# Patient Record
Sex: Female | Born: 1945 | Race: White | Hispanic: No | Marital: Single | Smoking: Never smoker
Health system: Southern US, Community
[De-identification: ages and names within clinical notes are randomized; demographics above are authoritative.]

## PROBLEM LIST (undated history)

## (undated) ENCOUNTER — Ambulatory Visit: Admission: EM | Payer: Medicare HMO | Source: Home / Self Care

## (undated) DIAGNOSIS — R7303 Prediabetes: Secondary | ICD-10-CM

## (undated) DIAGNOSIS — R609 Edema, unspecified: Secondary | ICD-10-CM

## (undated) DIAGNOSIS — E119 Type 2 diabetes mellitus without complications: Secondary | ICD-10-CM

## (undated) DIAGNOSIS — M792 Neuralgia and neuritis, unspecified: Secondary | ICD-10-CM

## (undated) DIAGNOSIS — D369 Benign neoplasm, unspecified site: Secondary | ICD-10-CM

## (undated) DIAGNOSIS — K219 Gastro-esophageal reflux disease without esophagitis: Secondary | ICD-10-CM

## (undated) HISTORY — DX: Type 2 diabetes mellitus without complications: E11.9

## (undated) HISTORY — PX: OTHER SURGICAL HISTORY: SHX169

## (undated) HISTORY — PX: REPLACEMENT TOTAL KNEE: SUR1224

## (undated) HISTORY — PX: HERNIA REPAIR: SHX51

## (undated) HISTORY — PX: TONSILLECTOMY: SUR1361

---

## 2008-06-16 ENCOUNTER — Ambulatory Visit: Payer: Self-pay

## 2009-10-12 ENCOUNTER — Ambulatory Visit: Payer: Self-pay

## 2010-04-29 ENCOUNTER — Ambulatory Visit: Payer: Self-pay | Admitting: Unknown Physician Specialty

## 2010-10-25 ENCOUNTER — Ambulatory Visit: Payer: Self-pay

## 2012-02-20 DIAGNOSIS — M1710 Unilateral primary osteoarthritis, unspecified knee: Secondary | ICD-10-CM | POA: Insufficient documentation

## 2012-02-22 ENCOUNTER — Ambulatory Visit: Payer: Self-pay

## 2013-08-20 DIAGNOSIS — H9319 Tinnitus, unspecified ear: Secondary | ICD-10-CM | POA: Insufficient documentation

## 2013-09-27 ENCOUNTER — Ambulatory Visit: Payer: Self-pay | Admitting: Family Medicine

## 2014-04-04 DIAGNOSIS — M25511 Pain in right shoulder: Secondary | ICD-10-CM | POA: Insufficient documentation

## 2014-08-28 DIAGNOSIS — M1711 Unilateral primary osteoarthritis, right knee: Secondary | ICD-10-CM | POA: Insufficient documentation

## 2014-11-13 ENCOUNTER — Ambulatory Visit: Payer: Self-pay | Admitting: Family Medicine

## 2014-11-13 LAB — URINALYSIS, COMPLETE
Bilirubin,UR: NEGATIVE
GLUCOSE, UR: NEGATIVE
Ketone: NEGATIVE
Nitrite: NEGATIVE
PH: 7 (ref 5.0–8.0)
Protein: NEGATIVE
SPECIFIC GRAVITY: 1.015 (ref 1.000–1.030)
WBC UR: >30 /HPF (ref 0–5)

## 2014-11-14 LAB — URINE CULTURE

## 2015-05-08 ENCOUNTER — Other Ambulatory Visit: Payer: Self-pay | Admitting: Family Medicine

## 2015-05-08 DIAGNOSIS — Z1231 Encounter for screening mammogram for malignant neoplasm of breast: Secondary | ICD-10-CM

## 2015-05-13 ENCOUNTER — Ambulatory Visit
Admission: RE | Admit: 2015-05-13 | Discharge: 2015-05-13 | Disposition: A | Discharge status: Home / Self Care | Payer: Medicare Other | Source: Ambulatory Visit | Attending: Family Medicine | Admitting: Family Medicine

## 2015-05-13 DIAGNOSIS — Z1231 Encounter for screening mammogram for malignant neoplasm of breast: Secondary | ICD-10-CM

## 2015-05-28 ENCOUNTER — Other Ambulatory Visit: Payer: Self-pay | Admitting: Family Medicine

## 2015-05-28 DIAGNOSIS — R928 Other abnormal and inconclusive findings on diagnostic imaging of breast: Secondary | ICD-10-CM

## 2015-05-29 ENCOUNTER — Ambulatory Visit
Admission: RE | Admit: 2015-05-29 | Discharge: 2015-05-29 | Disposition: A | Discharge status: Home / Self Care | Payer: Medicare Other | Source: Ambulatory Visit | Attending: Family Medicine | Admitting: Family Medicine

## 2015-05-29 DIAGNOSIS — R928 Other abnormal and inconclusive findings on diagnostic imaging of breast: Secondary | ICD-10-CM

## 2015-05-29 DIAGNOSIS — N6002 Solitary cyst of left breast: Secondary | ICD-10-CM | POA: Insufficient documentation

## 2015-05-29 DIAGNOSIS — N63 Unspecified lump in breast: Secondary | ICD-10-CM | POA: Diagnosis present

## 2015-06-03 DIAGNOSIS — Z96651 Presence of right artificial knee joint: Secondary | ICD-10-CM | POA: Insufficient documentation

## 2015-06-19 DIAGNOSIS — Z96651 Presence of right artificial knee joint: Secondary | ICD-10-CM | POA: Insufficient documentation

## 2015-07-30 ENCOUNTER — Encounter: Payer: Self-pay | Admitting: Physical Therapy

## 2015-07-30 ENCOUNTER — Ambulatory Visit: Payer: Medicare Other | Attending: Orthopaedic Surgery | Admitting: Physical Therapy

## 2015-07-30 DIAGNOSIS — R262 Difficulty in walking, not elsewhere classified: Secondary | ICD-10-CM | POA: Diagnosis present

## 2015-07-30 DIAGNOSIS — M25661 Stiffness of right knee, not elsewhere classified: Secondary | ICD-10-CM | POA: Diagnosis present

## 2015-07-30 DIAGNOSIS — M24669 Ankylosis, unspecified knee: Secondary | ICD-10-CM | POA: Diagnosis present

## 2015-07-30 DIAGNOSIS — M25561 Pain in right knee: Secondary | ICD-10-CM | POA: Diagnosis present

## 2015-07-30 DIAGNOSIS — M25669 Stiffness of unspecified knee, not elsewhere classified: Secondary | ICD-10-CM

## 2015-07-30 NOTE — Therapy (Signed)
[facility] [address] [place], [place], [address] Phone: [phone]   Fax:  [phone]  Physical Therapy Evaluation  Patient Details  Name: Marilyn Hayes MRN: [id number] Date of Birth: 1946/06/19 Referring Provider:  Myra Rude, MD  Encounter Date: 07/30/2015      PT End of Session - 07/30/15 1254    Visit Number 1   Number of Visits 8   Date for PT Re-Evaluation 08/27/15   Authorization - Visit Number 1   Authorization - Number of Visits 10   PT Start Time 1020   PT Stop Time 1107   PT Time Calculation (min) 47 min   Activity Tolerance Patient tolerated treatment well   Behavior During Therapy [facility] for tasks assessed/performed      History reviewed. No pertinent past medical history.  History reviewed. No pertinent past surgical history.  There were no vitals filed for this visit.  Visit Diagnosis:  Joint stiffness of knee, right  Pain in joint, lower leg, right  Decreased range of motion (ROM) of knee  Difficulty walking      Subjective Assessment - 07/30/15 1236    Subjective Pt reports her R TKR not feeling like her L TKR did. Pt reports pain at rest at a 3-4/10 with walking.  Pt reports having difficulty getting out of a chair and requires assistance from her friends to stand up. Pt reports receiving HEP from HHPT and continuing to do all standing exercises.  Pt. entered PT clinic with no assistive device.     Pertinent History L TKR 2012    Limitations Walking;Lifting   How long can you walk comfortably? < 5 min   Patient Stated Goals getting up out of a chair without the use of her arms or help of a friend/walking around [place] 2 times (30 mins)/bending her knee without stiffness/being able to play with her grandchildren and great grandchildren    Currently in Pain? Yes   Pain Score 3    Pain Location Knee   Pain Orientation Right   Pain Descriptors / Indicators  Burning;Tightness;Pressure   Pain Type Surgical pain   Pain Onset More than a month ago      OBJECTIVE: Nustep L6 5 min (cool down, no charge). Stretching to piriformis in sitting and gastroc/hamstring in standing (handout provided). Reviewed current HEP from HHPT and added side stepping.       [facility] PT Assessment - 07/30/15 0001    Assessment   Medical Diagnosis R TKA   Onset Date/Surgical Date 06/03/15   Prior Therapy HHPT   Precautions   Precautions None   Restrictions   Weight Bearing Restrictions No   Balance Screen   Has the patient fallen in the past 6 months No            PT Education - 07/30/15 1254    Education provided Yes   Education Details pt educated on importance of continuing to ice and continuing standing exercises. Pt provided exercise and stretching in handout above.    Person(s) Educated Patient   Methods Explanation;Demonstration;Handout   Comprehension Returned demonstration;Verbalized understanding             PT Long Term Goals - 07/30/15 1518    PT LONG TERM GOAL #1   Title Pt will increase LEFS score to > 55/80 in order to report pain-free functional mobility.   Baseline 9/8 a LEFS of 37/80.   Time 3  Period Weeks   Status New   PT LONG TERM GOAL #2   Title Pt will increase R knee flexion (AROM) to >120 deg in order to ambulate with a more normalized gait pattern.   Baseline 08/26/2023 R knee AROM: 106 deg, PROM 11 deg. L knee 124.    Time 3   Period Weeks   Status New   PT LONG TERM GOAL #3   Title Pt will ambulate with normalized gait pattern with no increase c/o pain for 10 minutes to improve overall functional mobility.     Time 3   Period Weeks   Status New   PT LONG TERM GOAL #4   Title Pt will be independent with HEP in order to perform sit to and from stand in normal height chair without the use of her arms.    Time 3   Period Weeks   Status New            Plan - 26-Aug-2015 1255    Clinical Impression Statement Pt is  a 69 y/o F s/p R TKA with DOS: 06/03/15. Pt has R knee pain at rest at a 3/10 and with walking a 4/10. Pt reports pain on the lateral side of her R knee and it being a burning and pulling pain with movement. Pt's AROM in supine: L knee, -4 degrees to 124 deg; R knee, -5 to 105 deg. PROM R knee flexion 111 deg. MMT: B LE strength grossly 5/5. Circumfrential measurements: L knee joint line 45 cm, L distal quad 49 cm, L mid gastroc 41.4 cm. R knee joint line 49 cm, R distal quad 55 cm, R mid gastroc 42.5 cm. Pt ambulates with mod. R hip ER/ toed out gait pattern with no assistive.  Pt has decreased R knee and hip extension with gait and rotations from the trunk to advance the R leg. Pt is able to ascend 4 stairs with B handrails with a step over step pattern. Pt is able to descend 4 stairs with B handrails and a step to gait pattern. LEFS: 37/80. Pt will benefit from short term skilled PT in order to address balance, gait and strength deficits to return to functional mobility.    Pt will benefit from skilled therapeutic intervention in order to improve on the following deficits Abnormal gait;Decreased range of motion;Difficulty walking;Decreased endurance;Hypomobility;Impaired flexibility;Increased edema;Pain;Improper body mechanics;Postural dysfunction;Decreased mobility;Decreased scar mobility;Decreased activity tolerance   Rehab Potential Good   PT Frequency 2x / week   PT Duration 3 weeks   PT Treatment/Interventions ADLs/Self Care Home Management;Cryotherapy;Moist Heat;Balance training;Therapeutic exercise;Therapeutic activities;Manual techniques;Functional mobility training;Stair training;Neuromuscular re-education;Scar mobilization;Passive range of motion;Patient/family education;Gait training   PT Next Visit Plan work on sit to stand from various heights/balance/obstacles/re-assess HEP and steps    PT Home Exercise Plan see handout   Consulted and Agree with Plan of Care Patient          G-Codes -  26-Aug-2015 1545    Functional Assessment Tool Used LEFS/ clinical judgement/ pain/ gait   Functional Limitation Mobility: Walking and moving around   Mobility: Walking and Moving Around Current Status (S3419) At least 40 percent but less than 60 percent impaired, limited or restricted   Mobility: Walking and Moving Around Goal Status (Q2229) At least 1 percent but less than 20 percent impaired, limited or restricted       Problem List There are no active problems to display for this patient.   Pura Spice 2015-08-26, 3:47 PM  [facility] [address] [place], [place], [address] Phone: [phone]   Fax:  [phone]

## 2015-08-04 ENCOUNTER — Ambulatory Visit: Payer: Medicare Other | Admitting: Physical Therapy

## 2015-08-04 ENCOUNTER — Encounter: Payer: Self-pay | Admitting: Physical Therapy

## 2015-08-04 DIAGNOSIS — M25561 Pain in right knee: Secondary | ICD-10-CM

## 2015-08-04 DIAGNOSIS — M25661 Stiffness of right knee, not elsewhere classified: Secondary | ICD-10-CM

## 2015-08-04 DIAGNOSIS — M25669 Stiffness of unspecified knee, not elsewhere classified: Secondary | ICD-10-CM

## 2015-08-04 DIAGNOSIS — R262 Difficulty in walking, not elsewhere classified: Secondary | ICD-10-CM

## 2015-08-04 NOTE — Therapy (Addendum)
[facility] [address] [place], [place], [address] Phone: [phone]   Fax:  [phone]  Physical Therapy Treatment  Patient Details  Name: Marilyn Hayes MRN: [id number] Date of Birth: August 28, 1946 Referring Provider:  Myra Rude, MD  Encounter Date: 08/04/2015      PT End of Session - 08/04/15 1112    Visit Number 2   Number of Visits 8   Date for PT Re-Evaluation 08/27/15   Authorization - Visit Number 2   Authorization - Number of Visits 10   PT Start Time [phone]   PT Stop Time 1105   PT Time Calculation (min) 67 min   Activity Tolerance Patient tolerated treatment well;Patient limited by pain   Behavior During Therapy [facility] for tasks assessed/performed      History reviewed. No pertinent past medical history.  History reviewed. No pertinent past surgical history.  There were no vitals filed for this visit.  Visit Diagnosis:  Joint stiffness of knee, right  Pain in joint, lower leg, right  Decreased range of motion (ROM) of knee  Difficulty walking      Subjective Assessment - 08/04/15 1111    Subjective Pt reports no pain upon arrival to PT tx session. Pt states that she spent a lot of time standing yesterday and had knee discomfort all day. Pt states with PROM her knee pain is a 4/10 and walking a 3/10.    Pertinent History L TKR 2012    Limitations Walking;Lifting   How long can you walk comfortably? < 5 min   Patient Stated Goals getting up out of a chair without the use of her arms or help of a friend/walking around [place] 2 times (30 mins)/bending her knee without stiffness/being able to play with her grandchildren and great grandchildren    Currently in Pain? Yes   Pain Score 4    Pain Location Knee   Pain Orientation Right   Pain Descriptors / Indicators Aching;Pressure;Sharp   Pain Type Chronic pain   Pain Onset More than a month ago   Pain Frequency Intermittent         OBJECTIVE: R knee flexion in supine: AROM: 118 deg, PROM: 123 deg There ex: Nustep L5 8 mins (warmup, no charge). In // bars: partial lunges over 6" step/ side stepping to R side/side step ups and over x 10 each. Resisted gait with 2 BTB all 4 planes x 5 each direction (limited distance with R side stepping due to R hip pain). TG: knee flexion 10 x 3 (verbal cues to increase R knee full ROM). Neuro re-ed: balance Airex: standing balance normal base of support EO and EC/adducted base of support EO/standing heel raises and toe raises x 15 each/standing single leg march x 30 each leg. Manual: Scar massage, patellar mobilizations all 4 directions grade II (limited by pain). Attempted grade II inferior mobilizations to increase knee extension but pt complaints of tenderness so stopped.   Pt response to Tx for medical necessity: ROM continues to improve with PT tx sessions. Pain with PROM limiting stretching. Good response to strength training in LEs.          PT Long Term Goals - 07/30/15 1518    PT LONG TERM GOAL #1   Title Pt will increase LEFS score to > 55/80 in order to report pain-free functional mobility.   Baseline 9/8 a LEFS of 37/80.   Time 3   Period Weeks  Status New   PT LONG TERM GOAL #2   Title Pt will increase R knee flexion (AROM) to >120 deg in order to ambulate with a more normalized gait pattern.   Baseline 9/8 R knee AROM: 106 deg, PROM 11 deg. L knee 124.    Time 3   Period Weeks   Status New   PT LONG TERM GOAL #3   Title Pt will ambulate with normalized gait pattern with no increase c/o pain for 10 minutes to improve overall functional mobility.     Time 3   Period Weeks   Status New   PT LONG TERM GOAL #4   Title Pt will be independent with HEP in order to perform sit to and from stand in normal height chair without the use of her arms.    Time 3   Period Weeks   Status New               Plan - 08/04/15 1112    Clinical Impression Statement Pt ROM  progressing to AROM knee flexion in supine: 118 deg, PROM in supine 123 deg. Pt reports discomfort and soreness with and after passive stretching. Pt ambulates with minimal gait deviations, R decreased knee extension and R toe out. Pt was able to perform all standing exercises without requiring a rest break of R hip extension (her position of comfort).    Pt will benefit from skilled therapeutic intervention in order to improve on the following deficits Abnormal gait;Decreased range of motion;Difficulty walking;Decreased endurance;Hypomobility;Impaired flexibility;Increased edema;Pain;Improper body mechanics;Postural dysfunction;Decreased mobility;Decreased scar mobility;Decreased activity tolerance   Rehab Potential Good   PT Frequency 2x / week   PT Duration 3 weeks   PT Treatment/Interventions ADLs/Self Care Home Management;Cryotherapy;Moist Heat;Balance training;Therapeutic exercise;Therapeutic activities;Manual techniques;Functional mobility training;Stair training;Neuromuscular re-education;Scar mobilization;Passive range of motion;Patient/family education;Gait training   PT Next Visit Plan sit to stand from < 19.5"/extension/PROM/flexion/squats in standing.    PT Home Exercise Plan continue with HEP. addressed proper stretching technique.    Consulted and Agree with Plan of Care Patient        Problem List There are no active problems to display for this patient.   Lavone Neri, SPT  08/04/2015, 4:15 PM  [facility] [address] [place], [place], [address] Phone: [phone]   Fax:  [phone]

## 2015-08-06 ENCOUNTER — Ambulatory Visit: Payer: Medicare Other | Admitting: Physical Therapy

## 2015-08-06 DIAGNOSIS — M25561 Pain in right knee: Secondary | ICD-10-CM

## 2015-08-06 DIAGNOSIS — R262 Difficulty in walking, not elsewhere classified: Secondary | ICD-10-CM

## 2015-08-06 DIAGNOSIS — M25669 Stiffness of unspecified knee, not elsewhere classified: Secondary | ICD-10-CM

## 2015-08-06 DIAGNOSIS — M25661 Stiffness of right knee, not elsewhere classified: Secondary | ICD-10-CM | POA: Diagnosis not present

## 2015-08-06 NOTE — Therapy (Signed)
[facility] [address] [place], [place], [address] Phone: [phone]   Fax:  [phone]  Physical Therapy Treatment  Patient Details  Name: Marilyn Hayes MRN: [id number] Date of Birth: 07-23-1946 Referring Provider:  Myra Rude, MD  Encounter Date: 08/06/2015      PT End of Session - 08/06/15 0915    Visit Number 3   Number of Visits 8   Date for PT Re-Evaluation 08/27/15   Authorization - Visit Number 3   Authorization - Number of Visits 10   PT Start Time 0907   PT Stop Time 1000   PT Time Calculation (min) 53 min   Activity Tolerance Patient tolerated treatment well;Patient limited by pain   Behavior During Therapy [facility] for tasks assessed/performed      No past medical history on file.  No past surgical history on file.  There were no vitals filed for this visit.  Visit Diagnosis:  Joint stiffness of knee, right  Pain in joint, lower leg, right  Decreased range of motion (ROM) of knee  Difficulty walking      Subjective Assessment - 08/06/15 0912    Subjective Pt reports pain at a 2/10 in her R knee. Pt states flexion PROM stretching hurt her knee last session and request no measuring today. Pt reports having to take Advil and using ice for the past 2 days.    Pertinent History L TKR 2012    Limitations Walking;Lifting   How long can you walk comfortably? < 5 min   Patient Stated Goals getting up out of a chair without the use of her arms or help of a friend/walking around [place] 2 times (30 mins)/bending her knee without stiffness/being able to play with her grandchildren and great grandchildren    Currently in Pain? Yes   Pain Score 2    Pain Location Knee   Pain Orientation Right   Pain Descriptors / Indicators Constant;Aching;Dull;Discomfort;Pressure   Pain Type Surgical pain   Pain Onset More than a month ago   Pain Frequency Constant           OBJECTIVE: There ex: Scifit  L7 10 mins (warmup, no charge). In // bars: partial lunges over 6" step/ side stepping to R side/side step ups and over x 10 each. Resisted gait with 2 BTB all 4 planes x 5 each direction ). Sit to stand from 20" mat table x 15. Sit to stand from 19.5" mat table x 10 (fatigue noted). Stairs stretching to promote R knee flexion with B UE support.    Pt response to Tx for medical necessity: ROM continues to improve with PT tx sessions. Pain with PROM limiting stretching. Good response to strength training in LEs.           PT Long Term Goals - 07/30/15 1518    PT LONG TERM GOAL #1   Title Pt will increase LEFS score to > 55/80 in order to report pain-free functional mobility.   Baseline 9/8 a LEFS of 37/80.   Time 3   Period Weeks   Status New   PT LONG TERM GOAL #2   Title Pt will increase R knee flexion (AROM) to >120 deg in order to ambulate with a more normalized gait pattern.   Baseline 9/8 R knee AROM: 106 deg, PROM 11 deg. L knee 124.    Time 3   Period Weeks   Status New   PT LONG  TERM GOAL #3   Title Pt will ambulate with normalized gait pattern with no increase c/o pain for 10 minutes to improve overall functional mobility.     Time 3   Period Weeks   Status New   PT LONG TERM GOAL #4   Title Pt will be independent with HEP in order to perform sit to and from stand in normal height chair without the use of her arms.    Time 3   Period Weeks   Status New      Problem List There are no active problems to display for this patient.  Pura Spice, PT, DPT # [id number]   08/06/2015, 1:01 PM  [facility] [address] [place], [place], [address] Phone: [phone]   Fax:  [phone]

## 2015-08-11 ENCOUNTER — Encounter: Payer: Self-pay | Admitting: Physical Therapy

## 2015-08-11 ENCOUNTER — Ambulatory Visit: Payer: Medicare Other | Admitting: Physical Therapy

## 2015-08-11 DIAGNOSIS — M25561 Pain in right knee: Secondary | ICD-10-CM

## 2015-08-11 DIAGNOSIS — R262 Difficulty in walking, not elsewhere classified: Secondary | ICD-10-CM

## 2015-08-11 DIAGNOSIS — M25661 Stiffness of right knee, not elsewhere classified: Secondary | ICD-10-CM | POA: Diagnosis not present

## 2015-08-11 DIAGNOSIS — M25669 Stiffness of unspecified knee, not elsewhere classified: Secondary | ICD-10-CM

## 2015-08-11 NOTE — Therapy (Signed)
[facility] [address] [place], [place], [address] Phone: [phone]   Fax:  [phone]  Physical Therapy Treatment  Patient Details  Name: Marilyn Hayes MRN: [id number] Date of Birth: 1946-02-09 Referring Provider:  Myra Rude, MD  Encounter Date: 08/11/2015      PT End of Session - 08/11/15 1013    Visit Number 4   Number of Visits 8   Date for PT Re-Evaluation 08/27/15   Authorization - Visit Number 4   Authorization - Number of Visits 10   PT Start Time 3810   PT Stop Time 1047   PT Time Calculation (min) 52 min   Activity Tolerance Patient tolerated treatment well;Patient limited by pain   Behavior During Therapy [facility] for tasks assessed/performed      History reviewed. No pertinent past medical history.  History reviewed. No pertinent past surgical history.  There were no vitals filed for this visit.  Visit Diagnosis:  Joint stiffness of knee, right  Pain in joint, lower leg, right  Decreased range of motion (ROM) of knee  Difficulty walking      Subjective Assessment - 08/11/15 1004    Subjective Pt reports no R knee pain upon arrival to PT tx session. Pt reports muscle soreness over the weekend in her R quad but able to complete her planned weekend activities. Pt was unable to do HEP over the weekend due to soreness. Pt reports her chair at home is 17" high.    Pertinent History L TKR 2012    Limitations Walking;Lifting   How long can you walk comfortably? < 5 min   Patient Stated Goals getting up out of a chair without the use of her arms or help of a friend/walking around [place] 2 times (30 mins)/bending her knee without stiffness/being able to play with her grandchildren and great grandchildren    Currently in Pain? No/denies        OBJECTIVE: R knee flexion AROM 113 degrees in supine.  There ex: Standing hip abduction/hamstring curls x 10 each leg with B UE support. SLR 15 x  2 in supine (cues to slow down and contract quad before lifting). Bridging 15 x 2 (verbal cues for time hold). Wall squats with white therapy ball x 10 for proper form. L6 Nustep for 8 mins (cooldown, no charge). Manual therapy: STM to R distal quad (no tenderness noted, tightness noted). Patellar mobilizations grade III all 4 planes 3 x 20 seconds. Scar tissue massage in cross friction plane.  Pt response to Tx for medical necessity: ROM continues to improve with PT tx sessions. Soreness limiting strengthening and ROM today. Continues to benefit from R quad strengthening to increase independence with getting out of a chair.          PT Education - 08/11/15 1010    Education provided Yes   Education Details Pt educated on delayed muscle soreness from quad strengthening and importance of continued strengthening exercises.    Person(s) Educated Patient   Methods Explanation   Comprehension Verbalized understanding             PT Long Term Goals - 07/30/15 1518    PT LONG TERM GOAL #1   Title Pt will increase LEFS score to > 55/80 in order to report pain-free functional mobility.   Baseline 9/8 a LEFS of 37/80.   Time 3   Period Weeks   Status New   PT LONG TERM GOAL #  2   Title Pt will increase R knee flexion (AROM) to >120 deg in order to ambulate with a more normalized gait pattern.   Baseline 9/8 R knee AROM: 106 deg, PROM 11 deg. L knee 124.    Time 3   Period Weeks   Status New   PT LONG TERM GOAL #3   Title Pt will ambulate with normalized gait pattern with no increase c/o pain for 10 minutes to improve overall functional mobility.     Time 3   Period Weeks   Status New   PT LONG TERM GOAL #4   Title Pt will be independent with HEP in order to perform sit to and from stand in normal height chair without the use of her arms.    Time 3   Period Weeks   Status New             Plan - 08/11/15 1115    Clinical Impression Statement Pt ambulates with  decreased R knee flexion but is able to clear her R foot with gait. Pt has decreased stance time on the R LE. Pt limited in activity tolerance secondary to pain and soreness. Pt R knee flexion AROM 113 degrees in supine. Pt still requiring constant verbal cues to slow down and perform exercises with proper technique.     Pt will benefit from skilled therapeutic intervention in order to improve on the following deficits Abnormal gait;Decreased range of motion;Difficulty walking;Decreased endurance;Hypomobility;Impaired flexibility;Increased edema;Pain;Improper body mechanics;Postural dysfunction;Decreased mobility;Decreased scar mobility;Decreased activity tolerance   Rehab Potential Good   Clinical Impairments Affecting Rehab Potential L TKR 2012   PT Frequency 2x / week   PT Duration 3 weeks   PT Treatment/Interventions ADLs/Self Care Home Management;Cryotherapy;Moist Heat;Balance training;Therapeutic exercise;Therapeutic activities;Manual techniques;Functional mobility training;Stair training;Neuromuscular re-education;Scar mobilization;Passive range of motion;Patient/family education;Gait training   PT Next Visit Plan sit to stand from < 19.5"/extension/PROM/flexion/squats in standing/progress back to standing strengthening.    PT Home Exercise Plan continue HEP    Recommended Other Services discuss silver sneakers with pt/water aerobics    Consulted and Agree with Plan of Care Patient        Problem List There are no active problems to display for this patient.  Lavone Neri, SPT  08/12/2015, 11:21 AM  [place] [facility] [address] [place], [place], [address] Phone: [phone]   Fax:  [phone]

## 2015-08-13 ENCOUNTER — Encounter: Payer: Self-pay | Admitting: Physical Therapy

## 2015-08-13 ENCOUNTER — Ambulatory Visit: Payer: Medicare Other | Admitting: Physical Therapy

## 2015-08-13 DIAGNOSIS — M25661 Stiffness of right knee, not elsewhere classified: Secondary | ICD-10-CM

## 2015-08-13 DIAGNOSIS — M25561 Pain in right knee: Secondary | ICD-10-CM

## 2015-08-13 DIAGNOSIS — M25669 Stiffness of unspecified knee, not elsewhere classified: Secondary | ICD-10-CM

## 2015-08-13 DIAGNOSIS — R262 Difficulty in walking, not elsewhere classified: Secondary | ICD-10-CM

## 2015-08-13 NOTE — Therapy (Signed)
[facility] [address] [place], [place], [address] Phone: [phone]   Fax:  [phone]  Physical Therapy Treatment  Patient Details  Name: Marilyn Hayes MRN: [id number] Date of Birth: Jun 08, 1946 Referring Provider:  Myra Rude, MD  Encounter Date: 08/13/2015      PT End of Session - 08/13/15 1240    Visit Number 5   Number of Visits 8   Date for PT Re-Evaluation 08/27/15   Authorization - Visit Number 5   Authorization - Number of Visits 10   PT Start Time 1025   PT Stop Time 1107   PT Time Calculation (min) 42 min   Activity Tolerance Patient tolerated treatment well;Patient limited by pain   Behavior During Therapy [facility] for tasks assessed/performed      History reviewed. No pertinent past medical history.  History reviewed. No pertinent past surgical history.  There were no vitals filed for this visit.  Visit Diagnosis:  Joint stiffness of knee, right  Pain in joint, lower leg, right  Decreased range of motion (ROM) of knee  Difficulty walking      Subjective Assessment - 08/13/15 1239    Subjective Pt reports R knee pain at 2/10 and distal thigh pain. Pt states her knee joint was hurting last night but feels better this morning.    Pertinent History L TKR 2012    Limitations Walking;Lifting   How long can you walk comfortably? < 5 min   Patient Stated Goals getting up out of a chair without the use of her arms or help of a friend/walking around [place] 2 times (30 mins)/bending her knee without stiffness/being able to play with her grandchildren and great grandchildren    Currently in Pain? Yes   Pain Score 2    Pain Location Knee   Pain Orientation Right   Pain Descriptors / Indicators Aching   Pain Type Chronic pain   Pain Onset More than a month ago   Pain Frequency Intermittent          OBJECTIVE: R knee flexion AAROM 121 degrees in supine.  Warmup: Scifit L7 10 mins F/B  (no charge). There ex with 2# ankle weights: Standing hip abduction/hamstring curls/ball squats/high marching/heel rasies 15 x 2 with each leg with B UE support. SLR 15 x 2 in supine (cues to slow down and contract quad before lifting). Bridging 15 x 2 SAQ with minimal manual resistance x 20 R leg (pt reported L hamstring cramping and difficulty with more than minimal resistance). AAROM to encourage R knee flexion (pt instructed to perform at home as part of HEP).  Pt response to Tx for medical necessity: Progressing with strengthening without complaints of pain or soreness. Continue to encourage R knee flexion for a normalized gait pattern.            PT Long Term Goals - 07/30/15 1518    PT LONG TERM GOAL #1   Title Pt will increase LEFS score to > 55/80 in order to report pain-free functional mobility.   Baseline 9/8 a LEFS of 37/80.   Time 3   Period Weeks   Status New   PT LONG TERM GOAL #2   Title Pt will increase R knee flexion (AROM) to >120 deg in order to ambulate with a more normalized gait pattern.   Baseline 9/8 R knee AROM: 106 deg, PROM 11 deg. L knee 124.    Time 3   Period  Weeks   Status New   PT LONG TERM GOAL #3   Title Pt will ambulate with normalized gait pattern with no increase c/o pain for 10 minutes to improve overall functional mobility.     Time 3   Period Weeks   Status New   PT LONG TERM GOAL #4   Title Pt will be independent with HEP in order to perform sit to and from stand in normal height chair without the use of her arms.    Time 3   Period Weeks   Status New            Plan - 08/13/15 1241    Clinical Impression Statement Pt able to perform standing exercise with 2# ankle weights with no increased c/o R knee pain. Pt is still lacking 7 degrees of R knee extension in supine and AAROM R knee flexion in supine is 121 degrees. Pt is able to stretch knee with AAROM but does not tolerate PROM well. Pt educated on importance of pushing  her flexion over the weekend.    Pt will benefit from skilled therapeutic intervention in order to improve on the following deficits Abnormal gait;Decreased range of motion;Difficulty walking;Decreased endurance;Hypomobility;Impaired flexibility;Increased edema;Pain;Improper body mechanics;Postural dysfunction;Decreased mobility;Decreased scar mobility;Decreased activity tolerance   Rehab Potential Good   Clinical Impairments Affecting Rehab Potential L TKR 2012   PT Frequency 2x / week   PT Duration 3 weeks   PT Treatment/Interventions ADLs/Self Care Home Management;Cryotherapy;Moist Heat;Balance training;Therapeutic exercise;Therapeutic activities;Manual techniques;Functional mobility training;Stair training;Neuromuscular re-education;Scar mobilization;Passive range of motion;Patient/family education;Gait training   PT Next Visit Plan sit to stand from < 19.5"/extension/PROM/flexion/squats in standing/progress back to standing strengthening.    PT Home Exercise Plan continue HEP    Consulted and Agree with Plan of Care Patient        Problem List There are no active problems to display for this patient.  Pura Spice, PT, DPT # [id number]   08/14/2015, 2:22 PM  [place] [facility] [address] [place], [place], [address] Phone: [phone]   Fax:  [phone]

## 2015-08-18 ENCOUNTER — Encounter: Payer: Self-pay | Admitting: Physical Therapy

## 2015-08-18 ENCOUNTER — Ambulatory Visit: Payer: Medicare Other | Admitting: Physical Therapy

## 2015-08-18 DIAGNOSIS — M25661 Stiffness of right knee, not elsewhere classified: Secondary | ICD-10-CM

## 2015-08-18 DIAGNOSIS — R262 Difficulty in walking, not elsewhere classified: Secondary | ICD-10-CM

## 2015-08-18 DIAGNOSIS — M25669 Stiffness of unspecified knee, not elsewhere classified: Secondary | ICD-10-CM

## 2015-08-18 DIAGNOSIS — M25561 Pain in right knee: Secondary | ICD-10-CM

## 2015-08-18 NOTE — Therapy (Signed)
[place] [facility] [address] [place], [place], [address] Phone: [phone]   Fax:  [phone]  Physical Therapy Treatment  Patient Details  Name: Marilyn Hayes MRN: [id number] Date of Birth: [birth year] Referring Provider:  Myra Rude, MD  Encounter Date: 08/18/2015      PT End of Session - 08/18/15 1131    Visit Number 6   Number of Visits 8   Date for PT Re-Evaluation 08/27/15   Authorization - Visit Number 6   Authorization - Number of Visits 10   PT Start Time 0959   PT Stop Time 1050   PT Time Calculation (min) 51 min   Activity Tolerance Patient tolerated treatment well   Behavior During Therapy [facility] for tasks assessed/performed      History reviewed. No pertinent past medical history.  History reviewed. No pertinent past surgical history.  There were no vitals filed for this visit.  Visit Diagnosis:  Joint stiffness of knee, right  Pain in joint, lower leg, right  Decreased range of motion (ROM) of knee  Difficulty walking      Subjective Assessment - 08/18/15 1131    Subjective Pt reports no knee pain or soreness upon arrival to PT tx session.    Pertinent History L TKR 2012    Limitations Walking;Lifting   How long can you walk comfortably? < 5 min   Patient Stated Goals getting up out of a chair without the use of her arms or help of a friend/walking around [place] 2 times (30 mins)/bending her knee without stiffness/being able to play with her grandchildren and great grandchildren    Currently in Pain? No/denies   Pain Onset More than a month ago       OBJECTIVE: R knee flexion AROM 117 degrees in supine.  Warmup: Scifit L7 10 mins Backwards (no charge). There ex: TG: 30 knee flexion and 30 calf raises. Resisted gait with 2 BTB with B UE support all four planes x 6 (lunge for 5 second hold on the front/side stepping in a mini squat position). Forwards and sideways step ups on BOSU 10  x 2 with B UE support. 5 sit to stand from 19" chair with one airex under her. Manual: STM to R distal quad (2 trigger points noted with c/o tenderness). Cross friction scar mobilization focused on distal end of scar (increased thickness noted).  Pt response to Tx for medical necessity: Progressing R knee flexion without c/o pain or soreness. Continues to ambulate with decreased R knee flexion and increased trunk sway. Difficulty noted with getting out of a normal height chair.         PT Long Term Goals - 07/30/15 1518    PT LONG TERM GOAL #1   Title Pt will increase LEFS score to > 55/80 in order to report pain-free functional mobility.   Baseline 9/8 a LEFS of 37/80.   Time 3   Period Weeks   Status New   PT LONG TERM GOAL #2   Title Pt will increase R knee flexion (AROM) to >120 deg in order to ambulate with a more normalized gait pattern.   Baseline 9/8 R knee AROM: 106 deg, PROM 11 deg. L knee 124.    Time 3   Period Weeks   Status New   PT LONG TERM GOAL #3   Title Pt will ambulate with normalized gait pattern with no increase c/o pain for 10 minutes to improve overall  functional mobility.     Time 3   Period Weeks   Status New   PT LONG TERM GOAL #4   Title Pt will be independent with HEP in order to perform sit to and from stand in normal height chair without the use of her arms.    Time 3   Period Weeks   Status New            Plan - 08/18/15 1132    Clinical Impression Statement Pt AROM R knee flexion: 117 degrees in supine. Pt performs balance and strenghtening step ups on the BOSU with bilateral UE support; pt is fearful of BOSU without UE support. Noted tenderness to R distal quad with STM. Pt scar moving well but thickness noted at distal end of scar. Pt limited overall by fatigue.     Pt will benefit from skilled therapeutic intervention in order to improve on the following deficits Abnormal gait;Decreased range of motion;Difficulty walking;Decreased  endurance;Hypomobility;Impaired flexibility;Increased edema;Pain;Improper body mechanics;Postural dysfunction;Decreased mobility;Decreased scar mobility;Decreased activity tolerance   Rehab Potential Good   Clinical Impairments Affecting Rehab Potential L TKR 2012   PT Frequency 2x / week   PT Duration 3 weeks   PT Treatment/Interventions ADLs/Self Care Home Management;Cryotherapy;Moist Heat;Balance training;Therapeutic exercise;Therapeutic activities;Manual techniques;Functional mobility training;Stair training;Neuromuscular re-education;Scar mobilization;Passive range of motion;Patient/family education;Gait training   PT Next Visit Plan sit to stand from < 19.5"/extension/PROM/flexion/squats in standing/progress back to standing strengthening. Check goals/ schedule next tx.     PT Home Exercise Plan continue HEP    Consulted and Agree with Plan of Care Patient        Problem List There are no active problems to display for this patient.  Pura Spice, PT, DPT # [id number]   08/19/2015, 1:21 PM  [place] [facility] [address] [place], [place], [address] Phone: [phone]   Fax:  [phone]

## 2015-08-20 ENCOUNTER — Ambulatory Visit: Payer: Medicare Other | Admitting: Physical Therapy

## 2015-08-20 DIAGNOSIS — M25661 Stiffness of right knee, not elsewhere classified: Secondary | ICD-10-CM | POA: Diagnosis not present

## 2015-08-20 DIAGNOSIS — R262 Difficulty in walking, not elsewhere classified: Secondary | ICD-10-CM

## 2015-08-20 DIAGNOSIS — M25561 Pain in right knee: Secondary | ICD-10-CM

## 2015-08-20 DIAGNOSIS — M25669 Stiffness of unspecified knee, not elsewhere classified: Secondary | ICD-10-CM

## 2015-08-20 NOTE — Therapy (Signed)
[place] [facility] [address] [place], [place], [address] Phone: [phone]   Fax:  [phone]  Physical Therapy Treatment  Patient Details  Name: Marilyn Hayes MRN: [id number] Date of Birth: 05/27/1946 Referring Provider:  Myra Rude, MD  Encounter Date: 08/20/2015      PT End of Session - 08/20/15 1707    Visit Number 7   Number of Visits 8   Date for PT Re-Evaluation 08/27/15   Authorization - Visit Number 7   Authorization - Number of Visits 10   PT Start Time 0959   PT Stop Time 1040   PT Time Calculation (min) 41 min   Activity Tolerance Patient tolerated treatment well   Behavior During Therapy [facility] for tasks assessed/performed      No past medical history on file.  No past surgical history on file.  There were no vitals filed for this visit.  Visit Diagnosis:  Joint stiffness of knee, right  Pain in joint, lower leg, right  Decreased range of motion (ROM) of knee  Difficulty walking      Subjective Assessment - 08/20/15 1658    Subjective Pt reports no knee pain upon arrival to PT tx. Pt states that she has R distal thigh pain due to using the ball of her foot more than flat foot. Pt states that she is giving up on getting out of her 17" chair until she gains more R quad strength.  Pt. wants to continue on an independent basis at this time until MD f/u on 08/31/15.   Pt. instructed to contact PT if any issues or questions over next 1-2 weeks.    Pertinent History L TKR 2012    Limitations Walking;Lifting   How long can you walk comfortably? < 5 min   Patient Stated Goals getting up out of a chair without the use of her arms or help of a friend/walking around [place] 2 times (30 mins)/bending her knee without stiffness/being able to play with her grandchildren and great grandchildren    Currently in Pain? No/denies   Pain Onset More than a month ago         OBJECTIVE: Warm-up: Scifit L7 10  mins B/F. There ex: BOSU step ups forward and sideways with one UE support 15 x 2 each (R LE on BOSU, L LE hoover). Resisted gait with 2 BTB all 4 planes. PROM stretching. Knees to chest with white therapy ball x 30.  Reviewed HEP in depth.   LEFS: 50/80 Left knee AROM in supine: -4-118 deg PROM knee flexion: 121 deg in supine    Pt response to Tx for medical necessity: Increased quad activation and strengthening of R LE to help normalize gait pattern. Continued need for strengthening to perform sit to stand from normal height chair. Increased complaints of soreness with there ex. At this time, pt is discharged from skilled PT.          PT Education - 08/20/15 1707    Education provided Yes   Education Details Pt educated to keep up strengthening exercises and icing as needed.    Person(s) Educated Patient   Methods Explanation   Comprehension Verbalized understanding             PT Long Term Goals - 08/20/15 1712    PT LONG TERM GOAL #1   Title Pt will increase LEFS score to > 55/80 in order to report pain-free functional mobility.   Baseline  9/8 a LEFS of 37/80.9/29 50/80   Time 3   Period Weeks   Status Partially Met   PT LONG TERM GOAL #2   Title Pt will increase R knee flexion (AROM) to >120 deg in order to ambulate with a more normalized gait pattern.   Baseline 9/8 R knee AROM: 106 deg, PROM 11 deg. L knee 124. 9/29 R knee AAROM: 118 deg, PROM 121 deg.   Time 3   Period Weeks   Status Partially Met   PT LONG TERM GOAL #3   Title Pt will ambulate with normalized gait pattern with no increase c/o pain for 10 minutes to improve overall functional mobility.     Time 3   Period Weeks   Status Achieved   PT LONG TERM GOAL #4   Title Pt will be independent with HEP in order to perform sit to and from stand in normal height chair without the use of her arms.    Time 3   Period Weeks   Status Not Met            Plan - 08/20/15 1708    Clinical Impression  Statement Pts AROM of R knee has progressed to -4 degrees to 118 degrees in supine. PROM for R knee flexion is guarded against and pain limited to 121 degrees. LEFS: 50/80. Pt has difficulty tranisitioning from knee extension to flexion in supine. Pt has adequate balance for ambulation as demonstrated by balance with BOSU ball step ups. At this time, pt feels comfortable transitioning to independent home exercise program focused on strengthening.    Pt will benefit from skilled therapeutic intervention in order to improve on the following deficits Abnormal gait;Decreased range of motion;Difficulty walking;Decreased endurance;Hypomobility;Impaired flexibility;Increased edema;Pain;Improper body mechanics;Postural dysfunction;Decreased mobility;Decreased scar mobility;Decreased activity tolerance   Rehab Potential Good   Clinical Impairments Affecting Rehab Potential L TKR 2012   PT Frequency 2x / week   PT Duration 3 weeks   PT Treatment/Interventions ADLs/Self Care Home Management;Cryotherapy;Moist Heat;Balance training;Therapeutic exercise;Therapeutic activities;Manual techniques;Functional mobility training;Stair training;Neuromuscular re-education;Scar mobilization;Passive range of motion;Patient/family education;Gait training   PT Home Exercise Plan continue HEP    Recommended Other Services Walking program   Consulted and Agree with Plan of Care Patient        Problem List There are no active problems to display for this patient.  Pura Spice, PT, DPT # [phone]   08/21/2015, 1:28 PM  [place] [facility] [address] [place], [place], [address] Phone: [phone]   Fax:  [phone]

## 2016-01-18 DIAGNOSIS — B079 Viral wart, unspecified: Secondary | ICD-10-CM | POA: Diagnosis not present

## 2016-01-18 DIAGNOSIS — L989 Disorder of the skin and subcutaneous tissue, unspecified: Secondary | ICD-10-CM | POA: Diagnosis not present

## 2016-01-18 DIAGNOSIS — B078 Other viral warts: Secondary | ICD-10-CM | POA: Diagnosis not present

## 2016-04-26 DIAGNOSIS — H25013 Cortical age-related cataract, bilateral: Secondary | ICD-10-CM | POA: Diagnosis not present

## 2016-05-09 DIAGNOSIS — R600 Localized edema: Secondary | ICD-10-CM | POA: Diagnosis not present

## 2016-05-09 DIAGNOSIS — Z8639 Personal history of other endocrine, nutritional and metabolic disease: Secondary | ICD-10-CM | POA: Diagnosis not present

## 2016-05-09 DIAGNOSIS — Z79899 Other long term (current) drug therapy: Secondary | ICD-10-CM | POA: Diagnosis not present

## 2016-05-09 DIAGNOSIS — G5712 Meralgia paresthetica, left lower limb: Secondary | ICD-10-CM | POA: Diagnosis not present

## 2016-05-09 DIAGNOSIS — I890 Lymphedema, not elsewhere classified: Secondary | ICD-10-CM | POA: Diagnosis not present

## 2016-05-09 DIAGNOSIS — Z0000 Encounter for general adult medical examination without abnormal findings: Secondary | ICD-10-CM | POA: Diagnosis not present

## 2016-05-09 DIAGNOSIS — Z1159 Encounter for screening for other viral diseases: Secondary | ICD-10-CM | POA: Diagnosis not present

## 2016-05-10 DIAGNOSIS — E119 Type 2 diabetes mellitus without complications: Secondary | ICD-10-CM | POA: Diagnosis not present

## 2016-05-10 DIAGNOSIS — R3915 Urgency of urination: Secondary | ICD-10-CM | POA: Diagnosis not present

## 2016-05-11 DIAGNOSIS — R69 Illness, unspecified: Secondary | ICD-10-CM | POA: Diagnosis not present

## 2016-05-13 ENCOUNTER — Other Ambulatory Visit: Payer: Self-pay | Admitting: Family Medicine

## 2016-05-13 DIAGNOSIS — Z1231 Encounter for screening mammogram for malignant neoplasm of breast: Secondary | ICD-10-CM

## 2016-05-23 ENCOUNTER — Ambulatory Visit
Admission: RE | Admit: 2016-05-23 | Discharge: 2016-05-23 | Disposition: A | Discharge status: Home / Self Care | Payer: Medicare HMO | Source: Ambulatory Visit | Attending: Family Medicine | Admitting: Family Medicine

## 2016-05-23 DIAGNOSIS — Z1231 Encounter for screening mammogram for malignant neoplasm of breast: Secondary | ICD-10-CM | POA: Diagnosis not present

## 2016-08-22 DIAGNOSIS — R69 Illness, unspecified: Secondary | ICD-10-CM | POA: Diagnosis not present

## 2016-11-17 DIAGNOSIS — R69 Illness, unspecified: Secondary | ICD-10-CM | POA: Diagnosis not present

## 2017-05-17 DIAGNOSIS — M1711 Unilateral primary osteoarthritis, right knee: Secondary | ICD-10-CM | POA: Diagnosis not present

## 2017-05-17 DIAGNOSIS — Z0000 Encounter for general adult medical examination without abnormal findings: Secondary | ICD-10-CM | POA: Diagnosis not present

## 2017-05-17 DIAGNOSIS — E119 Type 2 diabetes mellitus without complications: Secondary | ICD-10-CM | POA: Insufficient documentation

## 2017-05-17 DIAGNOSIS — Z1231 Encounter for screening mammogram for malignant neoplasm of breast: Secondary | ICD-10-CM | POA: Diagnosis not present

## 2017-05-17 DIAGNOSIS — K219 Gastro-esophageal reflux disease without esophagitis: Secondary | ICD-10-CM | POA: Diagnosis not present

## 2017-05-17 DIAGNOSIS — E1165 Type 2 diabetes mellitus with hyperglycemia: Secondary | ICD-10-CM | POA: Diagnosis not present

## 2017-05-17 DIAGNOSIS — G4700 Insomnia, unspecified: Secondary | ICD-10-CM | POA: Diagnosis not present

## 2017-05-17 DIAGNOSIS — R600 Localized edema: Secondary | ICD-10-CM | POA: Diagnosis not present

## 2017-05-17 DIAGNOSIS — G5712 Meralgia paresthetica, left lower limb: Secondary | ICD-10-CM | POA: Diagnosis not present

## 2017-05-17 DIAGNOSIS — Z862 Personal history of diseases of the blood and blood-forming organs and certain disorders involving the immune mechanism: Secondary | ICD-10-CM | POA: Diagnosis not present

## 2017-05-17 DIAGNOSIS — N951 Menopausal and female climacteric states: Secondary | ICD-10-CM | POA: Diagnosis not present

## 2017-05-17 DIAGNOSIS — M797 Fibromyalgia: Secondary | ICD-10-CM | POA: Diagnosis not present

## 2017-05-17 DIAGNOSIS — Z79899 Other long term (current) drug therapy: Secondary | ICD-10-CM | POA: Diagnosis not present

## 2017-05-17 DIAGNOSIS — Z13820 Encounter for screening for osteoporosis: Secondary | ICD-10-CM | POA: Diagnosis not present

## 2017-05-17 DIAGNOSIS — M1710 Unilateral primary osteoarthritis, unspecified knee: Secondary | ICD-10-CM | POA: Diagnosis not present

## 2017-05-18 ENCOUNTER — Other Ambulatory Visit: Payer: Self-pay | Admitting: Family Medicine

## 2017-05-18 DIAGNOSIS — Z1231 Encounter for screening mammogram for malignant neoplasm of breast: Secondary | ICD-10-CM

## 2017-05-26 DIAGNOSIS — N951 Menopausal and female climacteric states: Secondary | ICD-10-CM | POA: Diagnosis not present

## 2017-05-26 DIAGNOSIS — Z13820 Encounter for screening for osteoporosis: Secondary | ICD-10-CM | POA: Diagnosis not present

## 2017-05-29 ENCOUNTER — Ambulatory Visit
Admission: RE | Admit: 2017-05-29 | Discharge: 2017-05-29 | Disposition: A | Discharge status: Home / Self Care | Payer: Medicare HMO | Source: Ambulatory Visit | Attending: Family Medicine | Admitting: Family Medicine

## 2017-05-29 DIAGNOSIS — Z1231 Encounter for screening mammogram for malignant neoplasm of breast: Secondary | ICD-10-CM | POA: Insufficient documentation

## 2017-06-08 ENCOUNTER — Other Ambulatory Visit: Payer: Self-pay | Admitting: Family Medicine

## 2017-06-08 DIAGNOSIS — R928 Other abnormal and inconclusive findings on diagnostic imaging of breast: Secondary | ICD-10-CM

## 2017-06-08 DIAGNOSIS — N6489 Other specified disorders of breast: Secondary | ICD-10-CM

## 2017-06-14 ENCOUNTER — Ambulatory Visit
Admission: RE | Admit: 2017-06-14 | Discharge: 2017-06-14 | Disposition: A | Discharge status: Home / Self Care | Payer: Medicare HMO | Source: Ambulatory Visit | Attending: Family Medicine | Admitting: Family Medicine

## 2017-06-14 DIAGNOSIS — R928 Other abnormal and inconclusive findings on diagnostic imaging of breast: Secondary | ICD-10-CM | POA: Diagnosis not present

## 2017-06-14 DIAGNOSIS — N6489 Other specified disorders of breast: Secondary | ICD-10-CM | POA: Insufficient documentation

## 2017-07-16 ENCOUNTER — Ambulatory Visit
Admission: EM | Admit: 2017-07-16 | Discharge: 2017-07-16 | Disposition: A | Discharge status: Home / Self Care | Payer: Medicare HMO | Attending: Family Medicine | Admitting: Family Medicine

## 2017-07-16 ENCOUNTER — Encounter: Payer: Self-pay | Admitting: Gynecology

## 2017-07-16 DIAGNOSIS — N390 Urinary tract infection, site not specified: Secondary | ICD-10-CM | POA: Diagnosis not present

## 2017-07-16 DIAGNOSIS — R319 Hematuria, unspecified: Secondary | ICD-10-CM

## 2017-07-16 HISTORY — DX: Edema, unspecified: R60.9

## 2017-07-16 HISTORY — DX: Gastro-esophageal reflux disease without esophagitis: K21.9

## 2017-07-16 HISTORY — DX: Prediabetes: R73.03

## 2017-07-16 HISTORY — DX: Benign neoplasm, unspecified site: D36.9

## 2017-07-16 HISTORY — DX: Neuralgia and neuritis, unspecified: M79.2

## 2017-07-16 LAB — URINALYSIS, COMPLETE (UACMP) WITH MICROSCOPIC
Bilirubin Urine: NEGATIVE
Glucose, UA: NEGATIVE mg/dL
KETONES UR: NEGATIVE mg/dL
Nitrite: NEGATIVE
PROTEIN: NEGATIVE mg/dL
Specific Gravity, Urine: 1.015 (ref 1.005–1.030)
pH: 8 (ref 5.0–8.0)

## 2017-07-16 MED ORDER — CEPHALEXIN 500 MG PO CAPS: 500.0000 mg | ORAL_CAPSULE | Freq: Two times a day (BID) | ORAL | 0 refills | Status: AC

## 2017-07-16 NOTE — ED Triage Notes (Signed)
Patient c/o urine frequency and burning with urination.

## 2017-07-16 NOTE — ED Provider Notes (Signed)
[facility] ____________________________________________  Time seen: Approximately 11:45 AM  I have reviewed the triage vital signs and the nursing notes.   HISTORY  Chief Complaint Urinary Tract Infection  HPI Marilyn Hayes is a 71 y.o. female  presenting for evaluation of urinary frequency, urinary urgency and some burning with urination that has been present for the last 2 days. Reports history of previous urinary tract infections with similar presentation. States last urinary tract infection was approximately 6 months ago. Denies associated abdominal pain, back pain, vaginal discharge, vaginal complaints, fevers or other complaints. Reports continues to eat and drink well. Denies taking any over-the-counter medications for the same complaints. Denies other ameliorating factors. Reports otherwise feels well. Sates she is normally treated with cipro, and does well.   Denies chest pain, shortness of breath, abdominal pain, extremity pain, extremity swelling or rash. Denies recent sickness. Denies recent antibiotic use.   Pisharody, Remer Macho, MD: PCP   Past Medical History:  Diagnosis Date  . Adenoma   . Borderline diabetic   . Edema   . GERD (gastroesophageal reflux disease)   . Nerve pain     There are no active problems to display for this patient.   Past Surgical History:  Procedure Laterality Date  . colonscopy    . HERNIA REPAIR    . REPLACEMENT TOTAL KNEE    . TONSILLECTOMY       No current facility-administered medications for this encounter.   Current Outpatient Prescriptions:  .  gabapentin (NEURONTIN) 400 MG capsule, Take 400 mg by mouth 3 (three) times daily., Disp: , Rfl:  .  hydrochlorothiazide (HYDRODIURIL) 25 MG tablet, Take 25 mg by mouth daily., Disp: , Rfl:  .  ibuprofen (ADVIL,MOTRIN) 200 MG tablet, Take 200 mg by mouth every 6 (six) hours as needed., Disp: , Rfl:  .  omeprazole (PRILOSEC) 40 MG capsule, Take 40 mg by  mouth daily., Disp: , Rfl:  .  zolpidem (AMBIEN) 5 MG tablet, Take 5 mg by mouth at bedtime as needed for sleep., Disp: , Rfl:  .  cephALEXin (KEFLEX) 500 MG capsule, Take 1 capsule (500 mg total) by mouth 2 (two) times daily., Disp: 14 capsule, Rfl: 0  Allergies Patient has no known allergies.  Family History  Problem Relation Age of Onset  . Breast cancer Neg Hx     Social History Social History  Substance Use Topics  . Smoking status: Never Smoker  . Smokeless tobacco: Never Used  . Alcohol use Yes     Comment: social    Review of Systems Constitutional: No fever/chills Cardiovascular: Denies chest pain. Respiratory: Denies shortness of breath. Gastrointestinal: No abdominal pain.  Genitourinary: Positive for dysuria. Musculoskeletal: Negative for back pain. Skin: Negative for rash.   ____________________________________________   PHYSICAL EXAM:  VITAL SIGNS: ED Triage Vitals  Enc Vitals Group     BP 07/16/17 1055 (!) 151/74     Pulse Rate 07/16/17 1055 69     Resp 07/16/17 1055 16     Temp 07/16/17 1055 98.3 F (36.8 C)     Temp Source 07/16/17 1055 Oral     SpO2 07/16/17 1055 97 %     Weight 07/16/17 1046 230 lb (104.3 kg)     Height 07/16/17 1046 5\' 6"  (1.676 m)     Head Circumference --      Peak Flow --      Pain Score 07/16/17 1047 6     Pain Loc --  Pain Edu? --      Excl. in [place]? --     Constitutional: Alert and oriented. Well appearing and in no acute distress. Cardiovascular: Normal rate, regular rhythm. Grossly normal heart sounds.  Good peripheral circulation. Respiratory: Normal respiratory effort without tachypnea nor retractions. Breath sounds are clear and equal bilaterally. No wheezes, rales, rhonchi. Gastrointestinal: Soft and nontender.  No CVA tenderness. Musculoskeletal:No midline cervical, thoracic or lumbar tenderness to palpation.  Neurologic:  Normal speech and language.  Speech is normal. No gait instability.  Skin:  Skin  is warm, dry. Psychiatric: Mood and affect are normal. Speech and behavior are normal. Patient exhibits appropriate insight and judgment   ___________________________________________   LABS (all labs ordered are listed, but only abnormal results are displayed)  Labs Reviewed  URINALYSIS, COMPLETE (UACMP) WITH MICROSCOPIC - Abnormal; Notable for the following:       Result Value   Color, Urine STRAW (*)    APPearance CLOUDY (*)    Hgb urine dipstick SMALL (*)    Leukocytes, UA SMALL (*)    Squamous Epithelial / LPF 6-30 (*)    Bacteria, UA RARE (*)    All other components within normal limits  URINE CULTURE    PROCEDURES Procedures   INITIAL IMPRESSION / ASSESSMENT AND PLAN / ED COURSE  Pertinent labs & imaging results that were available during my care of the patient were reviewed by me and considered in my medical decision making (see chart for details).  Well appearing patient. Urinalysis reviewed, suspect UTI. Will culture urine. Patient requests to be treated with oral Cipro, discussed and counseled  use that this antibiotic including side effects and complications, and as patient reports no previous resistance to other anitiobitcs. Patient agrees to use another antibiotic. Will treat patient with oral Keflex and await urine culture. Encourage rest, fluids and supportive care.Discussed indication, risks and benefits of medications with patient.  Discussed follow up with Primary care physician this week. Discussed follow up and return parameters including no resolution or any worsening concerns. Patient verbalized understanding and agreed to plan.   ____________________________________________   FINAL CLINICAL IMPRESSION(S) / ED DIAGNOSES  Final diagnoses:  Urinary tract infection with hematuria, site unspecified     Discharge Medication List as of 07/16/2017 11:39 AM    START taking these medications   Details  cephALEXin (KEFLEX) 500 MG capsule Take 1 capsule (500  mg total) by mouth 2 (two) times daily., Starting Sun 07/16/2017, Until Sun 07/23/2017, Normal        Note: This dictation was prepared with Dragon dictation along with smaller phrase technology. Any transcriptional errors that result from this process are unintentional.         Marylene Land, NP 07/16/17 1220

## 2017-07-16 NOTE — Discharge Instructions (Signed)
Take medication as prescribed. Rest. Drink plenty of fluids.  ° °Follow up with your primary care physician this week as needed. Return to Urgent care for new or worsening concerns.  ° °

## 2017-07-18 LAB — URINE CULTURE

## 2017-08-24 DIAGNOSIS — R69 Illness, unspecified: Secondary | ICD-10-CM | POA: Diagnosis not present

## 2017-09-02 ENCOUNTER — Ambulatory Visit
Admission: EM | Admit: 2017-09-02 | Discharge: 2017-09-02 | Disposition: A | Discharge status: Home / Self Care | Payer: Medicare HMO | Attending: Emergency Medicine | Admitting: Emergency Medicine

## 2017-09-02 ENCOUNTER — Encounter: Payer: Self-pay | Admitting: Emergency Medicine

## 2017-09-02 DIAGNOSIS — Z8679 Personal history of other diseases of the circulatory system: Secondary | ICD-10-CM

## 2017-09-02 DIAGNOSIS — R319 Hematuria, unspecified: Secondary | ICD-10-CM

## 2017-09-02 DIAGNOSIS — R350 Frequency of micturition: Secondary | ICD-10-CM | POA: Diagnosis not present

## 2017-09-02 DIAGNOSIS — R030 Elevated blood-pressure reading, without diagnosis of hypertension: Secondary | ICD-10-CM | POA: Diagnosis present

## 2017-09-02 DIAGNOSIS — B379 Candidiasis, unspecified: Secondary | ICD-10-CM | POA: Diagnosis present

## 2017-09-02 DIAGNOSIS — N390 Urinary tract infection, site not specified: Secondary | ICD-10-CM | POA: Diagnosis present

## 2017-09-02 DIAGNOSIS — R300 Dysuria: Secondary | ICD-10-CM | POA: Diagnosis not present

## 2017-09-02 LAB — URINALYSIS, COMPLETE (UACMP) WITH MICROSCOPIC
Bilirubin Urine: NEGATIVE
Glucose, UA: NEGATIVE mg/dL
Ketones, ur: NEGATIVE mg/dL
Nitrite: POSITIVE — AB
Protein, ur: NEGATIVE mg/dL
Specific Gravity, Urine: 1.015 (ref 1.005–1.030)
pH: 7.5 (ref 5.0–8.0)

## 2017-09-02 MED ORDER — CEPHALEXIN 500 MG PO CAPS: 500.0000 mg | ORAL_CAPSULE | Freq: Four times a day (QID) | ORAL | 0 refills | Status: AC

## 2017-09-02 MED ORDER — FLUCONAZOLE 150 MG PO TABS: ORAL_TABLET | ORAL | 0 refills | Status: DC

## 2017-09-02 NOTE — ED Triage Notes (Signed)
Patient in today c/o 2 days of urinary frequency and dysuria. Patient has a history of UTIs.

## 2017-09-02 NOTE — Discharge Instructions (Signed)
Please follow up with your PCP, with recurrent UTI's recommend urologist evaluation. Rest,push fluids, take abx as directed. Go to ER for fever, nausea, abdominal pain/back pain. Return to UC as needed.

## 2017-09-02 NOTE — ED Provider Notes (Signed)
[facility]    CSN: [id number] Arrival date & time: 09/02/17  1047     History   Chief Complaint Chief Complaint  Patient presents with  . Urinary Frequency  . Dysuria    HPI Marilyn Hayes is a 71 y.o. female.   The history is provided by the patient. No language interpreter was used.  Dysuria  Pain quality:  Aching and burning Pain severity:  Mild Onset quality:  Sudden Duration:  2 days Timing:  Constant Progression:  Unchanged Chronicity:  Recurrent Recent urinary tract infections: yes   Relieved by:  Antibiotics Worsened by:  Nothing Urinary symptoms: discolored urine and frequent urination   Associated symptoms: no abdominal pain, no fever, no flank pain, no genital lesions, no nausea, no vaginal discharge and no vomiting   Risk factors: recurrent urinary tract infections     Past Medical History:  Diagnosis Date  . Adenoma   . Borderline diabetic   . Edema   . GERD (gastroesophageal reflux disease)   . Nerve pain     Patient Active Problem List   Diagnosis Date Noted  . Dysuria 09/02/2017  . Urinary tract infection with hematuria 09/02/2017  . Yeast infection 09/02/2017  . Hx of essential hypertension 09/02/2017  . Elevated blood pressure reading 09/02/2017    Past Surgical History:  Procedure Laterality Date  . colonscopy    . HERNIA REPAIR    . REPLACEMENT TOTAL KNEE    . TONSILLECTOMY      OB History    No data available       Home Medications    Prior to Admission medications   Medication Sig Start Date End Date Taking? Authorizing Provider  gabapentin (NEURONTIN) 400 MG capsule Take 400 mg by mouth 3 (three) times daily.   Yes [provider]  hydrochlorothiazide (HYDRODIURIL) 25 MG tablet Take 25 mg by mouth daily.   Yes [provider]  ibuprofen (ADVIL,MOTRIN) 200 MG tablet Take 200 mg by mouth every 6 (six) hours as needed.   Yes [provider]  omeprazole (PRILOSEC) 40 MG  capsule Take 40 mg by mouth daily.   Yes [provider]  zolpidem (AMBIEN) 5 MG tablet Take 5 mg by mouth at bedtime as needed for sleep.   Yes [provider]  cephALEXin (KEFLEX) 500 MG capsule Take 1 capsule (500 mg total) by mouth 4 (four) times daily. 67/61/95 09/32/67  Avia Merkley, Jeanett Schlein, NP  fluconazole (DIFLUCAN) 150 MG tablet Take tab now, repeat days 3,5 12/45/80   Lavance Beazer, Jeanett Schlein, NP    Family History Family History  Problem Relation Age of Onset  . Breast cancer Neg Hx     Social History Social History  Substance Use Topics  . Smoking status: Never Smoker  . Smokeless tobacco: Never Used  . Alcohol use Yes     Comment: social     Allergies   Patient has no known allergies.   Review of Systems Review of Systems  Constitutional: Negative for chills and fever.  HENT: Negative for ear pain and sore throat.   Eyes: Negative for pain and visual disturbance.  Respiratory: Negative for cough and shortness of breath.   Cardiovascular: Negative for chest pain and palpitations.  Gastrointestinal: Negative for abdominal pain, nausea and vomiting.  Genitourinary: Positive for dysuria, frequency and urgency. Negative for flank pain, hematuria and vaginal discharge.  Musculoskeletal: Negative for arthralgias and back pain.  Skin: Negative for color change and rash.  Neurological:  Negative for seizures and syncope.  All other systems reviewed and are negative.    Physical Exam Triage Vital Signs ED Triage Vitals  Enc Vitals Group     BP 09/02/17 1112 (!) 143/54     Pulse Rate 09/02/17 1112 65     Resp 09/02/17 1112 16     Temp 09/02/17 1112 97.8 F (36.6 C)     Temp Source 09/02/17 1112 Oral     SpO2 09/02/17 1112 98 %     Weight 09/02/17 1108 230 lb (104.3 kg)     Height 09/02/17 1108 5' 6.5" (1.689 m)     Head Circumference --      Peak Flow --      Pain Score 09/02/17 1109 6     Pain Loc --      Pain Edu? --      Excl. in [place]? --    No  data found.   Updated Vital Signs BP (!) 143/54 (BP Location: Left Arm)   Pulse 65   Temp 97.8 F (36.6 C) (Oral)   Resp 16   Ht 5' 6.5" (1.689 m)   Wt 230 lb (104.3 kg)   SpO2 98%   BMI 36.57 kg/m   Visual Acuity Right Eye Distance:   Left Eye Distance:   Bilateral Distance:    Right Eye Near:   Left Eye Near:    Bilateral Near:     Physical Exam  Constitutional: She is oriented to person, place, and time. Vital signs are normal. She appears well-developed and well-nourished. She is active and cooperative. No distress.  HENT:  Head: Normocephalic.  Eyes: Pupils are equal, round, and reactive to light.  Neck: Normal range of motion.  Cardiovascular: Normal rate, regular rhythm and normal pulses.   Pulmonary/Chest: Effort normal and breath sounds normal.  Abdominal: Normal appearance and bowel sounds are normal. There is no tenderness.  Musculoskeletal: Normal range of motion.  Neurological: She is alert and oriented to person, place, and time. GCS eye subscore is 4. GCS verbal subscore is 5. GCS motor subscore is 6.  Skin: Skin is warm and dry.  Psychiatric: She has a normal mood and affect. Her speech is normal and behavior is normal.  Nursing note and vitals reviewed.    UC Treatments / Results  Labs (all labs ordered are listed, but only abnormal results are displayed) Labs Reviewed  URINALYSIS, COMPLETE (UACMP) WITH MICROSCOPIC - Abnormal; Notable for the following:       Result Value   APPearance CLOUDY (*)    Hgb urine dipstick MODERATE (*)    Nitrite POSITIVE (*)    Leukocytes, UA MODERATE (*)    Squamous Epithelial / LPF 6-30 (*)    Bacteria, UA MANY (*)    All other components within normal limits  URINE CULTURE    EKG  EKG Interpretation None       Radiology No results found.  Procedures Procedures (including critical care time)  Medications Ordered in UC Medications - No data to display   Initial Impression / Assessment and Plan /  UC Course  I have reviewed the triage vital signs and the nursing notes.  Pertinent labs & imaging results that were available during my care of the patient were reviewed by me and considered in my medical decision making (see chart for details).   Please follow up with your PCP, with recurrent UTI's recommend urologist evaluation. Rest,push fluids, take abx as directed. Go to ER  for fever, nausea, abdominal pain/back pain. Return to UC as needed.     Final Clinical Impressions(s) / UC Diagnoses   Final diagnoses:  Dysuria  Urinary tract infection with hematuria, site unspecified  Yeast infection  Hx of essential hypertension  Elevated blood pressure reading    New Prescriptions Discharge Medication List as of 09/02/2017  1:01 PM    START taking these medications   Details  cephALEXin (KEFLEX) 500 MG capsule Take 1 capsule (500 mg total) by mouth 4 (four) times daily., Starting Sat 09/02/2017, Until Sat 09/09/2017, Normal    fluconazole (DIFLUCAN) 150 MG tablet Take tab now, repeat days 3,5, Normal         Controlled Substance Prescriptions    Hiromi Knodel, Jeanett Schlein, NP 11/55/20 1635

## 2017-09-05 LAB — URINE CULTURE: Culture: >=100000 — AB

## 2017-09-13 NOTE — Progress Notes (Signed)
09/15/2017 11:06 AM   Marilyn Hayes 07-27-1946 [id number]  Referring provider: Daryel Gerald, MD [place], [place] [address]  Chief Complaint  Patient presents with  . New Patient (Initial Visit)  . Urinary Tract Infection    HPI: Patient is a 71 -year-old Caucasian female who is referred to [place] by, Dr. Lindie Spruce, for recurrent urinary tract infections.  Patient states that she has had 5 urinary tract infections over the last year.  She states this has been the issue her whole life.    Reviewing her records,  she has had one documented UTI for E.coli and one urine culture positive for multiple organisms.   Unfortunately, she has had most of her UTI's treated through urgent care's, so we do not have cultures available to [place].  Her symptoms with a urinary tract infection consist of frequency, dysuria, urge incontinence and microscopic hematuria.    She denies dysuria, gross hematuria, suprapubic pain, back pain, abdominal pain or flank pain at this time.  She has not had any recent fevers, chills, nausea or vomiting.  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  Her PVR is 66 mL.    She does not have a history of nephrolithiasis, GU surgery or GU trauma.  She is not sexually active.  She is postmenopausal. She does not have a history of breast cancer, blood clots or strokes.    She denies constipation and/or diarrhea.   She does engage in good perineal hygiene. She does not take tub baths.   She does not have incontinence.  She is using incontinence pads daily (POISE PADS light).  She leaks a few drops only.    She is not having pain with bladder filling.    She has not had any recent imaging studies.    She is not drinking a lot of water daily.   She has one cup of coffee daily.  No alcohol.  Limited juice.    Reviewed referral notes - patient requested referral  PMH: Past  Medical History:  Diagnosis Date  . Adenoma   . Borderline diabetic   . Diabetes mellitus without complication ([place])   . Edema   . GERD (gastroesophageal reflux disease)   . Nerve pain     Surgical History: Past Surgical History:  Procedure Laterality Date  . colonscopy    . HERNIA REPAIR    . REPLACEMENT TOTAL KNEE    . TONSILLECTOMY      Home Medications:  Allergies as of 09/15/2017   No Known Allergies     Medication List       Accurate as of 09/15/17 11:06 AM. Always use your most recent med list.          estradiol 0.1 MG/GM vaginal cream Commonly known as:  ESTRACE Place 1 Applicatorful vaginally at bedtime.   gabapentin 400 MG capsule Commonly known as:  NEURONTIN Take 400 mg by mouth 3 (three) times daily.   hydrochlorothiazide 25 MG tablet Commonly known as:  HYDRODIURIL Take 25 mg by mouth daily.   ibuprofen 200 MG tablet Commonly known as:  ADVIL,MOTRIN Take 200 mg by mouth every 6 (six) hours as needed.   omeprazole 40 MG capsule Commonly known as:  PRILOSEC Take 40 mg by mouth daily.   Omeprazole-Sodium Bicarbonate 20-1100 MG Caps capsule Commonly known as:  ZEGERID Take by mouth.   zolpidem 5 MG tablet  Commonly known as:  AMBIEN Take 5 mg by mouth at bedtime as needed for sleep.       Allergies: No Known Allergies  Family History: Family History  Problem Relation Age of Onset  . Breast cancer Neg Hx   . Kidney cancer Neg Hx   . Kidney disease Neg Hx   . Prostate cancer Neg Hx     Social History:  reports that she has never smoked. She has never used smokeless tobacco. She reports that she does not drink alcohol or use drugs.  ROS: UROLOGY Frequent Urination?: Yes Hard to postpone urination?: Yes Burning/pain with urination?: No Get up at night to urinate?: No Leakage of urine?: Yes Urine stream starts and stops?: No Trouble starting stream?: No Do you have to strain to urinate?: No Blood in urine?: No Urinary tract  infection?: Yes Sexually transmitted disease?: No Injury to kidneys or bladder?: No Painful intercourse?: No Weak stream?: No Currently pregnant?: No Vaginal bleeding?: No Last menstrual period?: n  Gastrointestinal Nausea?: No Vomiting?: No Indigestion/heartburn?: Yes Diarrhea?: No Constipation?: No  Constitutional Fever: No Night sweats?: No Weight loss?: No Fatigue?: No  Skin Skin rash/lesions?: No Itching?: No  Eyes Blurred vision?: No Double vision?: No  Ears/Nose/Throat Sore throat?: No Sinus problems?: No  Hematologic/Lymphatic Swollen glands?: No Easy bruising?: No  Cardiovascular Leg swelling?: Yes Chest pain?: No  Respiratory Cough?: No Shortness of breath?: No  Endocrine Excessive thirst?: No  Musculoskeletal Back pain?: No Joint pain?: Yes  Neurological Headaches?: No Dizziness?: No  Psychologic Depression?: No Anxiety?: No  Physical Exam: BP (!) 155/71   Pulse 70   Ht _0  (1.676 m)   Wt 230 lb (104.3 kg)   BMI 37.12 kg/m   Constitutional: Well nourished. Alert and oriented, No acute distress. HEENT: [place] AT, moist mucus membranes. Trachea midline, no masses. Cardiovascular: No clubbing, cyanosis, or edema. Respiratory: Normal respiratory effort, no increased work of breathing. GI: Abdomen is soft, non tender, non distended, no abdominal masses. Liver and spleen not palpable.  No hernias appreciated.  Stool sample for occult testing is not indicated.   GU: No CVA tenderness.  No bladder fullness or masses.  Atrophic external genitalia, normal pubic hair distribution, no lesions.  Normal urethral meatus, no lesions, no prolapse, no discharge.   No urethral masses, tenderness and/or tenderness. No bladder fullness, tenderness or masses. Pale vagina mucosa, poor estrogen effect, no discharge, no lesions, good pelvic support, no cystocele or rectocele noted.  No cervical motion tenderness.  Uterus is freely mobile and non-fixed.  No  adnexal/parametria masses or tenderness noted.  Anus and perineum are without rashes or lesions.    Skin: No rashes, bruises or suspicious lesions. Lymph: No cervical or inguinal adenopathy. Neurologic: Grossly intact, no focal deficits, moving all 4 extremities. Psychiatric: Normal mood and affect.  Laboratory Data: Urinalysis    Component Value Date/Time   COLORURINE YELLOW 09/02/2017 1108   APPEARANCEUR CLOUDY (A) 09/02/2017 1108   APPEARANCEUR CLOUDY 11/13/2014 0928   LABSPEC 1.015 09/02/2017 1108   LABSPEC 1.015 11/13/2014 0928   PHURINE 7.5 09/02/2017 1108   GLUCOSEU NEGATIVE 09/02/2017 1108   GLUCOSEU NEGATIVE 11/13/2014 0928   HGBUR MODERATE (A) 09/02/2017 1108   BILIRUBINUR NEGATIVE 09/02/2017 1108   [place] 11/13/2014 0928   KETONESUR NEGATIVE 09/02/2017 1108   PROTEINUR NEGATIVE 09/02/2017 1108   NITRITE POSITIVE (A) 09/02/2017 1108   LEUKOCYTESUR MODERATE (A) 09/02/2017 1108   LEUKOCYTESUR MODERATE 11/13/2014 3009  I have reviewed the labs.   Pertinent Imaging: Results for KALISA, GIRTMAN (MRN [id number]) as of 09/15/2017 10:43  Ref. Range 09/15/2017 10:33  Scan Result Unknown 66   I have independently reviewed the films.    Assessment & Plan:    1. Cystitis  - criteria for recurrent UTI has not been met with 2 or more infections in 6 months or 3 or greater infections in one year  - Patient is instructed to increase their water intake until the urine is pale yellow or clear (10 to 12 cups daily)   - probiotics (yogurt, oral pills or vaginal suppositories), take cranberry pills or drink the juice and Vitamin C 1,000 mg daily to acidify the urine should be added to their daily regimen   - avoid soaking in tubs and wipe front to back after urinating   - advised them to have CATH UA's for urinalysis and culture to prevent skin contamination of the specimen  - reviewed symptoms of UTI and advised not to have urine checked or be treated for UTI  if not experiencing symptoms  - discussed antibiotic stewardship with the patient    2. Vaginal atrophy  - I explained to the patient that when women go through menopause and her estrogen levels are severely diminished, the normal vaginal flora will change.  This is due to an increase of the vaginal canal's pH. Because of this, the vaginal canal may be colonized by bacteria from the rectum instead of the protective lactobacillus.  This accompanied by the loss of the mucus barrier with vaginal atrophy is a cause of recurrent urinary tract infections.  - In some studies, the use of vaginal estrogen cream has been demonstrated to reduce  recurrent urinary tract infections to one a year.   - Patient was given a prescription of vaginal estrogen cream (Premarin/Estrace) and instructed to apply 0.62m (pea-sized amount)  just inside the vaginal introitus with a finger-tip every night for two weeks and then Monday, Wednesday and Friday nights.  I explained to the patient that vaginally administered estrogen, which causes only a slight increase in the blood estrogen levels, have fewer contraindications and adverse systemic effects that oral HT.  - She will follow up in three months for an exam.                                            Return in about 3 months (around 12/16/2017) for OAB questionnaire, PVR and exam.  These notes generated with voice recognition software. I apologize for typographical errors.  [facility] [address] [place] [phone]

## 2017-09-15 ENCOUNTER — Ambulatory Visit (INDEPENDENT_AMBULATORY_CARE_PROVIDER_SITE_OTHER): Payer: Medicare HMO | Admitting: Urology

## 2017-09-15 ENCOUNTER — Encounter: Payer: Self-pay | Admitting: Urology

## 2017-09-15 VITALS — BP 155/71 | HR 70 | Ht 66.0 in | Wt 230.0 lb

## 2017-09-15 DIAGNOSIS — N952 Postmenopausal atrophic vaginitis: Secondary | ICD-10-CM

## 2017-09-15 DIAGNOSIS — N3020 Other chronic cystitis without hematuria: Secondary | ICD-10-CM

## 2017-09-15 LAB — BLADDER SCAN AMB NON-IMAGING: SCAN RESULT: 66

## 2017-09-15 MED ORDER — ESTRADIOL 0.1 MG/GM VA CREA: 1.0000 | TOPICAL_CREAM | Freq: Every day | VAGINAL | 12 refills | Status: DC

## 2017-09-15 NOTE — Patient Instructions (Addendum)
                                             Urinary Tract Infection Prevention Patient Education Stay Hydrated: Urinary tract infections (UTIs) are less likely to occur in someone who is drinking enough water to promote regular urination, so it is very important to stay hydrated in order to help flush out bacteria from the urinary tract. (1.5 L daily) Respond to "Nature's Call": It is always a good idea to urinate as soon as you feel the need. While "holding it in" does not directly cause an infection, it can cause overdistension that can damage the lining of the bladder, making it more vulnerable to bacteria. Remove Tampons Before Going: Remember to always take out tampons before urinating, and change tampons often.  Practice Proper Bathroom Hygiene: To keep bacteria near the urethral opening to a minimum, it is important to practice proper wiping techniques (i.e. front to back wiping) to help prevent rectal bacteria from entering the uretro-genital area. It can also be helpful to take showers and avoid soaking in the bathtub.  Take a Vitamin C Supplement: About 1,000 milligrams of vitamin C taken daily can help inhibit the growth of some bacteria by acidifying the urine. Maintain Control with Cranberries: Cranberries contain hippuronic acid, which is a natural antiseptic that may help prevent the adherence of bacteria to the bladder lining. Drinking 100% pure cranberry juice or taking over the counter cranberry supplements twice daily may help to prevent an infection. However, it is important to note that cranberry juices/supplements are not helpful once a urinary tract infection (UTI) is present. Strengthen Your Core: Often, a lazy bladder (unable to empty urine properly) occurs due to lower back problem, so consider doing exercises to help strengthen your back, pelvic floor, and stomach muscles.  Pay Attention to Your Urine: Your urine can change color for a variety of reasons, including from the  medications you take, so pay close attention to it to monitor your overall health. One key thing to note is that if your urine is typically a darker yellow, your body is dehydrated, so you need to step up your water intake.   The estrogen cream is applied on Monday, Wednesday and Friday nights by placing a small amount on your finger-tip (size of a blueberry) to just inside the vagina.

## 2017-12-08 ENCOUNTER — Ambulatory Visit: Payer: Medicare HMO | Admitting: Urology

## 2017-12-17 ENCOUNTER — Encounter: Payer: Self-pay | Admitting: Gynecology

## 2017-12-17 ENCOUNTER — Ambulatory Visit
Admission: EM | Admit: 2017-12-17 | Discharge: 2017-12-17 | Disposition: A | Discharge status: Home / Self Care | Payer: Medicare HMO | Attending: Emergency Medicine | Admitting: Emergency Medicine

## 2017-12-17 DIAGNOSIS — R3915 Urgency of urination: Secondary | ICD-10-CM

## 2017-12-17 DIAGNOSIS — R350 Frequency of micturition: Secondary | ICD-10-CM

## 2017-12-17 DIAGNOSIS — R300 Dysuria: Secondary | ICD-10-CM

## 2017-12-17 LAB — URINALYSIS, COMPLETE (UACMP) WITH MICROSCOPIC

## 2017-12-17 MED ORDER — PHENAZOPYRIDINE HCL 200 MG PO TABS: 200.0000 mg | ORAL_TABLET | Freq: Three times a day (TID) | ORAL | 0 refills | Status: DC | PRN

## 2017-12-17 MED ORDER — CEPHALEXIN 500 MG PO CAPS: 500.0000 mg | ORAL_CAPSULE | Freq: Four times a day (QID) | ORAL | 0 refills | Status: AC

## 2017-12-17 NOTE — ED Provider Notes (Signed)
HPI  SUBJECTIVE:  Marilyn Hayes is a 71 y.o. female who presents with dysuria, urgency, frequency starting today.  She denies cloudy, odorous urine, hematuria.  She tried Azo with improvement in her symptoms.  No aggravating factors.  She denies nausea, vomiting, fevers, abdominal, back, pelvic pain.  No vaginal bleeding, odor, itching, rash, discharge.  She states that she has not been sexually active in some time.  She states this feels identical to previous UTIs.  She has a past medical history of vaginal yeast infections.  No history of BV.  She has diet-controlled diabetes.  No hypertension.  PMD: Daryel Gerald, MD  She has a past medical history of recurrent UTIs and was seen by Dr. Ernestine Conrad, urology, for chronic cystitis back in October 2018.  Her last urine culture grew out E. coli that was sensitive to Keflex, Macrobid, Cipro.  Resistant to Bactrim.  Past Medical History:  Diagnosis Date  . Adenoma   . Borderline diabetic   . Diabetes mellitus without complication ([place])   . Edema   . GERD (gastroesophageal reflux disease)   . Nerve pain     Past Surgical History:  Procedure Laterality Date  . colonscopy    . HERNIA REPAIR    . REPLACEMENT TOTAL KNEE    . TONSILLECTOMY      Family History  Problem Relation Age of Onset  . Breast cancer Neg Hx   . Kidney cancer Neg Hx   . Kidney disease Neg Hx   . Prostate cancer Neg Hx     Social History   Tobacco Use  . Smoking status: Never Smoker  . Smokeless tobacco: Never Used  Substance Use Topics  . Alcohol use: No    Comment: social  . Drug use: No    No current facility-administered medications for this encounter.   Current Outpatient Medications:  .  estradiol (ESTRACE) 0.1 MG/GM vaginal cream, Place 1 Applicatorful vaginally at bedtime., Disp: 42.5 g, Rfl: 12 .  gabapentin (NEURONTIN) 400 MG capsule, Take 400 mg by mouth 3 (three) times daily., Disp: , Rfl:  .  hydrochlorothiazide  (HYDRODIURIL) 25 MG tablet, Take 25 mg by mouth daily., Disp: , Rfl:  .  ibuprofen (ADVIL,MOTRIN) 200 MG tablet, Take 200 mg by mouth every 6 (six) hours as needed., Disp: , Rfl:  .  Omeprazole-Sodium Bicarbonate (ZEGERID) 20-1100 MG CAPS capsule, Take by mouth., Disp: , Rfl:  .  zolpidem (AMBIEN) 5 MG tablet, Take 5 mg by mouth at bedtime as needed for sleep., Disp: , Rfl:  .  omeprazole (PRILOSEC) 40 MG capsule, Take 40 mg by mouth daily., Disp: , Rfl:   No Known Allergies   ROS  As noted in HPI.   Physical Exam  BP (!) 122/91 (BP Location: Right Arm)   Pulse 79   Temp 98.5 F (36.9 C) (Oral)   Resp 16   Ht 5\' 6"  (1.676 m)   Wt 230 lb (104.3 kg)   SpO2 97%   BMI 37.12 kg/m   Constitutional: Well developed, well nourished, no acute distress Eyes:  EOMI, conjunctiva normal bilaterally HENT: Normocephalic, atraumatic,mucus membranes moist Respiratory: Normal inspiratory effort Cardiovascular: Normal rate GI: nondistended soft, no suprapubic or flank tenderness. Back: No CVAT skin: No rash, skin intact Musculoskeletal: no deformities Neurologic: Alert & oriented x 3, no focal neuro deficits Psychiatric: Speech and behavior appropriate   ED Course   Medications - No data to display  Orders Placed This Encounter  Procedures  .  Urine culture    Standing Status:   Standing    Number of Occurrences:   1    Order Specific Question:   List patient's active antibiotics    Answer:   keflex    Order Specific Question:   Patient immune status    Answer:   Normal  . Urinalysis, Complete w Microscopic    Standing Status:   Standing    Number of Occurrences:   1    Results for orders placed or performed during the hospital encounter of 12/17/17 (from the past 24 hour(s))  Urinalysis, Complete w Microscopic     Status: Abnormal   Collection Time: 12/17/17  3:25 PM  Result Value Ref Range   Color, Urine ORANGE (A) YELLOW   APPearance HAZY (A) CLEAR   Specific Gravity,  Urine  1.005 - 1.030    TEST NOT REPORTED DUE TO COLOR INTERFERENCE OF URINE PIGMENT   pH  5.0 - 8.0    TEST NOT REPORTED DUE TO COLOR INTERFERENCE OF URINE PIGMENT   Glucose, UA (A) NEGATIVE mg/dL    TEST NOT REPORTED DUE TO COLOR INTERFERENCE OF URINE PIGMENT   Hgb urine dipstick (A) NEGATIVE    TEST NOT REPORTED DUE TO COLOR INTERFERENCE OF URINE PIGMENT   Bilirubin Urine (A) NEGATIVE    TEST NOT REPORTED DUE TO COLOR INTERFERENCE OF URINE PIGMENT   Ketones, ur (A) NEGATIVE mg/dL    TEST NOT REPORTED DUE TO COLOR INTERFERENCE OF URINE PIGMENT   Protein, ur (A) NEGATIVE mg/dL    TEST NOT REPORTED DUE TO COLOR INTERFERENCE OF URINE PIGMENT   Nitrite (A) NEGATIVE    TEST NOT REPORTED DUE TO COLOR INTERFERENCE OF URINE PIGMENT   Leukocytes, UA (A) NEGATIVE    TEST NOT REPORTED DUE TO COLOR INTERFERENCE OF URINE PIGMENT   Squamous Epithelial / LPF 0-5 (A) NONE SEEN   WBC, UA 21-50 0 - 5 WBC/hpf   RBC / HPF 0-5 0 - 5 RBC/hpf   Bacteria, UA FEW (A) NONE SEEN   WBC Clumps PRESENT    Mucus PRESENT    Hyaline Casts, UA PRESENT    Granular Casts, UA PRESENT    No results found.  ED Clinical Impression  Dysuria   ED Assessment/Plan  Previous labs, records reviewed.  As noted in HPI.  Urinalysis not done because she took Azo prior to arrival.  However her symptoms are consistent with a UTI, so we will send this off for culture to confirm diagnosis of UTI and to confirm antibiotic choice.  We will have her follow-up with her primary care physician or with urology.  Home with Keflex, Pyridium.  She states that she was not responding to the Keflex 500 mg twice a day so that was increased to 500 4 times daily and she responded to this.  Push fluids.  Discussed with that there are other causes of dysuria, but she is willing to try treating as if this is a UTI initially. No orders of the defined types were placed in this encounter.   *This clinic note was created using Dragon dictation  software. Therefore, there may be occasional mistakes despite careful proofreading.   ?   Melynda Ripple, MD 12/17/17 1626

## 2017-12-17 NOTE — ED Triage Notes (Signed)
Per patient painful urination x today.

## 2017-12-19 LAB — URINE CULTURE: SPECIAL REQUESTS: NORMAL

## 2017-12-20 ENCOUNTER — Telehealth: Payer: Self-pay

## 2017-12-20 NOTE — Telephone Encounter (Signed)
Called to follow up with patient since visit here at [facility]. Spoke with pt. Reports improvement with antibiotic, informed of lab results. Patient instructed to call back with any questions or concerns. [facility]

## 2018-01-02 DIAGNOSIS — R69 Illness, unspecified: Secondary | ICD-10-CM | POA: Diagnosis not present

## 2018-01-30 ENCOUNTER — Ambulatory Visit
Admission: EM | Admit: 2018-01-30 | Discharge: 2018-01-30 | Disposition: A | Discharge status: Home / Self Care | Payer: Medicare HMO | Attending: Family Medicine | Admitting: Family Medicine

## 2018-01-30 ENCOUNTER — Other Ambulatory Visit: Payer: Self-pay

## 2018-01-30 DIAGNOSIS — R350 Frequency of micturition: Secondary | ICD-10-CM | POA: Diagnosis not present

## 2018-01-30 DIAGNOSIS — R300 Dysuria: Secondary | ICD-10-CM

## 2018-01-30 DIAGNOSIS — N390 Urinary tract infection, site not specified: Secondary | ICD-10-CM

## 2018-01-30 LAB — URINALYSIS, COMPLETE (UACMP) WITH MICROSCOPIC
BILIRUBIN URINE: NEGATIVE
Glucose, UA: NEGATIVE mg/dL
Ketones, ur: NEGATIVE mg/dL
NITRITE: NEGATIVE
PH: 7 (ref 5.0–8.0)
Protein, ur: NEGATIVE mg/dL
SPECIFIC GRAVITY, URINE: 1.015 (ref 1.005–1.030)

## 2018-01-30 MED ORDER — CEPHALEXIN 500 MG PO CAPS: 500.0000 mg | ORAL_CAPSULE | Freq: Four times a day (QID) | ORAL | 0 refills | Status: DC

## 2018-01-30 NOTE — ED Provider Notes (Signed)
[facility]    CSN: [id number] Arrival date & time: 01/30/18  1207     History   Chief Complaint Chief Complaint  Patient presents with  . Dysuria    HPI Marilyn Hayes is a 72 y.o. female.   HPI  72 year old female presents with symptoms of dysuria and frequency as well as and satiety.  She states that it feels exactly like her last couple of UTIs.  He has made an appointment with urologist in [place] on May 2.  Last time she was here she was given Keflex 500 mg 4 times a day which she states works better.  No vaginal symptoms.  She denies any fever chills nausea or vomiting.     Past Medical History:  Diagnosis Date  . Adenoma   . Borderline diabetic   . Diabetes mellitus without complication ([place])   . Edema   . GERD (gastroesophageal reflux disease)   . Nerve pain     Patient Active Problem List   Diagnosis Date Noted  . Dysuria 09/02/2017  . Urinary tract infection with hematuria 09/02/2017  . Yeast infection 09/02/2017  . Hx of essential hypertension 09/02/2017  . Elevated blood pressure reading 09/02/2017    Past Surgical History:  Procedure Laterality Date  . colonscopy    . HERNIA REPAIR    . REPLACEMENT TOTAL KNEE    . TONSILLECTOMY      OB History    No data available       Home Medications    Prior to Admission medications   Medication Sig Start Date End Date Taking? Authorizing Provider  cephALEXin (KEFLEX) 500 MG capsule Take 1 capsule (500 mg total) by mouth 4 (four) times daily. 01/30/18   Lorin Picket, PA-C  gabapentin (NEURONTIN) 400 MG capsule Take 400 mg by mouth 3 (three) times daily.    [provider]  hydrochlorothiazide (HYDRODIURIL) 25 MG tablet Take 25 mg by mouth daily.    [provider]  ibuprofen (ADVIL,MOTRIN) 200 MG tablet Take 200 mg by mouth every 6 (six) hours as needed.    [provider]  Omeprazole-Sodium Bicarbonate (ZEGERID) 20-1100 MG CAPS capsule Take by  mouth. 05/09/16   [provider]  zolpidem (AMBIEN) 5 MG tablet Take 5 mg by mouth at bedtime as needed for sleep.    [provider]    Family History Family History  Adopted: Yes  Problem Relation Age of Onset  . Breast cancer Neg Hx   . Kidney cancer Neg Hx   . Kidney disease Neg Hx   . Prostate cancer Neg Hx     Social History Social History   Tobacco Use  . Smoking status: Never Smoker  . Smokeless tobacco: Never Used  Substance Use Topics  . Alcohol use: Yes    Comment: social  . Drug use: No     Allergies   Patient has no known allergies.   Review of Systems Review of Systems  Constitutional: Negative for activity change, appetite change, chills, diaphoresis, fatigue and fever.  Genitourinary: Positive for dysuria, frequency and urgency. Negative for vaginal bleeding, vaginal discharge and vaginal pain.  All other systems reviewed and are negative.    Physical Exam Triage Vital Signs ED Triage Vitals  Enc Vitals Group     BP 01/30/18 1223 116/73     Pulse Rate 01/30/18 1223 71     Resp 01/30/18 1223 20     Temp 01/30/18 1223 98 F (  36.7 C)     Temp Source 01/30/18 1223 Oral     SpO2 01/30/18 1223 96 %     Weight 01/30/18 1224 230 lb (104.3 kg)     Height 01/30/18 1224 5\' 6"  (1.676 m)     Head Circumference --      Peak Flow --      Pain Score 01/30/18 1224 0     Pain Loc --      Pain Edu? --      Excl. in [place]? --    No data found.  Updated Vital Signs BP 116/73 (BP Location: Left Arm)   Pulse 71   Temp 98 F (36.7 C) (Oral)   Resp 20   Ht 5\' 6"  (1.676 m)   Wt 230 lb (104.3 kg)   SpO2 96%   BMI 37.12 kg/m   Visual Acuity Right Eye Distance:   Left Eye Distance:   Bilateral Distance:    Right Eye Near:   Left Eye Near:    Bilateral Near:     Physical Exam  Constitutional: She is oriented to person, place, and time. She appears well-developed and well-nourished. No distress.  HENT:  Head: Normocephalic.  Eyes:  Pupils are equal, round, and reactive to light. Right eye exhibits no discharge. Left eye exhibits no discharge.  Neck: Normal range of motion.  Pulmonary/Chest: Effort normal and breath sounds normal.  Abdominal: Soft. Bowel sounds are normal.  There is no CVA tenderness  Musculoskeletal: Normal range of motion.  Neurological: She is alert and oriented to person, place, and time.  Skin: Skin is warm and dry. She is not diaphoretic.  Psychiatric: She has a normal mood and affect. Her behavior is normal. Judgment and thought content normal.  Nursing note and vitals reviewed.    UC Treatments / Results  Labs (all labs ordered are listed, but only abnormal results are displayed) Labs Reviewed  URINALYSIS, COMPLETE (UACMP) WITH MICROSCOPIC - Abnormal; Notable for the following components:      Result Value   APPearance CLOUDY (*)    Hgb urine dipstick MODERATE (*)    Leukocytes, UA LARGE (*)    Squamous Epithelial / LPF 6-30 (*)    Bacteria, UA RARE (*)    All other components within normal limits  URINE CULTURE    EKG  EKG Interpretation None       Radiology No results found.  Procedures Procedures (including critical care time)  Medications Ordered in UC Medications - No data to display   Initial Impression / Assessment and Plan / UC Course  I have reviewed the triage vital signs and the nursing notes.  Pertinent labs & imaging results that were available during my care of the patient were reviewed by me and considered in my medical decision making (see chart for details).     Plan: 1. Test/x-ray results and diagnosis reviewed with patient 2. rx as per orders; risks, benefits, potential side effects reviewed with patient 3. Recommend supportive treatment with plenty of water.  Complete taking your full prescription.  Has Azo at home and will use that for no longer than 2 days.  He should follow-up with the urologist on May second. 4. F/u prn if symptoms worsen or  don't improve   Final Clinical Impressions(s) / UC Diagnoses   Final diagnoses:  Lower urinary tract infectious disease    ED Discharge Orders        Ordered    cephALEXin (KEFLEX) 500 MG  capsule  4 times daily     01/30/18 1341       Controlled Substance Prescriptions  Controlled Substance Registry consulted? Not Applicable   Lorin Picket, PA-C 01/30/18 1350

## 2018-01-30 NOTE — ED Triage Notes (Signed)
Pt with UTI sx including frequency and dysuria. Feels like her last UTI.

## 2018-02-01 LAB — URINE CULTURE: Culture: >=100000 — AB

## 2018-03-08 ENCOUNTER — Other Ambulatory Visit: Payer: Self-pay

## 2018-03-08 ENCOUNTER — Ambulatory Visit
Admission: EM | Admit: 2018-03-08 | Discharge: 2018-03-08 | Disposition: A | Discharge status: Home / Self Care | Payer: Medicare HMO | Attending: Family Medicine | Admitting: Family Medicine

## 2018-03-08 DIAGNOSIS — R350 Frequency of micturition: Secondary | ICD-10-CM

## 2018-03-08 DIAGNOSIS — R3915 Urgency of urination: Secondary | ICD-10-CM | POA: Diagnosis not present

## 2018-03-08 DIAGNOSIS — R300 Dysuria: Secondary | ICD-10-CM | POA: Diagnosis not present

## 2018-03-08 DIAGNOSIS — N3001 Acute cystitis with hematuria: Secondary | ICD-10-CM

## 2018-03-08 LAB — URINALYSIS, COMPLETE (UACMP) WITH MICROSCOPIC
GLUCOSE, UA: NEGATIVE mg/dL
Ketones, ur: NEGATIVE mg/dL
Nitrite: NEGATIVE
PH: 6.5 (ref 5.0–8.0)
Protein, ur: 30 mg/dL — AB
SPECIFIC GRAVITY, URINE: 1.02 (ref 1.005–1.030)

## 2018-03-08 MED ORDER — CEFDINIR 300 MG PO CAPS: 300.0000 mg | ORAL_CAPSULE | Freq: Two times a day (BID) | ORAL | 0 refills | Status: DC

## 2018-03-08 NOTE — ED Triage Notes (Signed)
Patient complains of dysuria, frequency and urgency that started this morning.

## 2018-03-08 NOTE — ED Provider Notes (Signed)
[facility]    CSN: [id number] Arrival date & time: 03/08/18  0858  History   Chief Complaint Chief Complaint  Patient presents with  . Dysuria   HPI  72 year old female with recurrent UTI presents with urinary symptoms.  Symptoms started this morning.  She reports dysuria, urinary frequency, and urgency.  No fevers or chills.  No back pain.  No flank pain.  No nausea vomiting.  No known exacerbating relieving factors.  No other associated symptoms.  No other complaints.  Past Medical History:  Diagnosis Date  . Adenoma   . Borderline diabetic   . Diabetes mellitus without complication ([place])   . Edema   . GERD (gastroesophageal reflux disease)   . Nerve pain     Patient Active Problem List   Diagnosis Date Noted  . Dysuria 09/02/2017  . Urinary tract infection with hematuria 09/02/2017  . Yeast infection 09/02/2017  . Hx of essential hypertension 09/02/2017  . Elevated blood pressure reading 09/02/2017    Past Surgical History:  Procedure Laterality Date  . colonscopy    . HERNIA REPAIR    . REPLACEMENT TOTAL KNEE    . TONSILLECTOMY      OB History   None      Home Medications    Prior to Admission medications   Medication Sig Start Date End Date Taking? Authorizing Provider  gabapentin (NEURONTIN) 400 MG capsule Take 400 mg by mouth 3 (three) times daily.   Yes [provider]  hydrochlorothiazide (HYDRODIURIL) 25 MG tablet Take 25 mg by mouth daily.   Yes [provider]  ibuprofen (ADVIL,MOTRIN) 200 MG tablet Take 200 mg by mouth every 6 (six) hours as needed.   Yes [provider]  Omeprazole-Sodium Bicarbonate (ZEGERID) 20-1100 MG CAPS capsule Take by mouth. 05/09/16  Yes [provider]  zolpidem (AMBIEN) 5 MG tablet Take 5 mg by mouth at bedtime as needed for sleep.   Yes [provider]  cefdinir (OMNICEF) 300 MG capsule Take 1 capsule (300 mg total) by mouth 2 (two) times daily. 03/08/18    Coral Spikes, DO    Family History Family History  Adopted: Yes  Problem Relation Age of Onset  . Breast cancer Neg Hx   . Kidney cancer Neg Hx   . Kidney disease Neg Hx   . Prostate cancer Neg Hx     Social History Social History   Tobacco Use  . Smoking status: Never Smoker  . Smokeless tobacco: Never Used  Substance Use Topics  . Alcohol use: Yes    Comment: social  . Drug use: No     Allergies   Patient has no known allergies.   Review of Systems Review of Systems  Constitutional: Negative.   Gastrointestinal: Negative.   Genitourinary: Positive for dysuria, frequency and urgency. Negative for flank pain.  Musculoskeletal: Negative for back pain.   Physical Exam Triage Vital Signs ED Triage Vitals  Enc Vitals Group     BP 03/08/18 0914 115/77     Pulse Rate 03/08/18 0914 64     Resp 03/08/18 0914 17     Temp 03/08/18 0914 98 F (36.7 C)     Temp Source 03/08/18 0914 Oral     SpO2 03/08/18 0914 96 %     Weight 03/08/18 0912 230 lb (104.3 kg)     Height 03/08/18 0912 5\' 6"  (1.676 m)     Head Circumference --      Peak  Flow --      Pain Score 03/08/18 0912 4     Pain Loc --      Pain Edu? --      Excl. in [place]? --    Updated Vital Signs BP 115/77 (BP Location: Left Arm)   Pulse 64   Temp 98 F (36.7 C) (Oral)   Resp 17   Ht 5\' 6"  (1.676 m)   Wt 230 lb (104.3 kg)   SpO2 96%   BMI 37.12 kg/m   Physical Exam  Constitutional: She is oriented to person, place, and time. She appears well-developed. No distress.  Cardiovascular: Normal rate and regular rhythm.  Pulmonary/Chest: Effort normal and breath sounds normal. She has no wheezes. She has no rales.  Abdominal: Soft. She exhibits no distension. There is no tenderness.  Neurological: She is alert and oriented to person, place, and time.  Psychiatric: She has a normal mood and affect. Her behavior is normal.  Nursing note and vitals reviewed.  UC Treatments / Results  Labs (all labs ordered  are listed, but only abnormal results are displayed) Labs Reviewed  URINALYSIS, COMPLETE (UACMP) WITH MICROSCOPIC - Abnormal; Notable for the following components:      Result Value   APPearance CLOUDY (*)    Hgb urine dipstick MODERATE (*)    Bilirubin Urine SMALL (*)    Protein, ur 30 (*)    Leukocytes, UA LARGE (*)    Squamous Epithelial / LPF 6-30 (*)    Bacteria, UA FEW (*)    All other components within normal limits    EKG None Radiology No results found.  Procedures Procedures (including critical care time)  Medications Ordered in UC Medications - No data to display   Initial Impression / Assessment and Plan / UC Course  I have reviewed the triage vital signs and the nursing notes.  Pertinent labs & imaging results that were available during my care of the patient were reviewed by me and considered in my medical decision making (see chart for details).     72 year old female presents with UTI.  Placing on Omnicef.  Sending culture.  Final Clinical Impressions(s) / UC Diagnoses   Final diagnoses:  Acute cystitis with hematuria    ED Discharge Orders        Ordered    cefdinir (OMNICEF) 300 MG capsule  2 times daily     03/08/18 0944     Controlled Substance Prescriptions [place] Controlled Substance Registry consulted? Not Applicable   Coral Spikes, DO 03/08/18 1017

## 2018-03-09 LAB — URINE CULTURE

## 2018-03-22 DIAGNOSIS — M353 Polymyalgia rheumatica: Secondary | ICD-10-CM | POA: Diagnosis not present

## 2018-03-22 DIAGNOSIS — R300 Dysuria: Secondary | ICD-10-CM | POA: Diagnosis not present

## 2018-03-22 DIAGNOSIS — N390 Urinary tract infection, site not specified: Secondary | ICD-10-CM | POA: Diagnosis not present

## 2018-03-22 DIAGNOSIS — R339 Retention of urine, unspecified: Secondary | ICD-10-CM | POA: Diagnosis not present

## 2018-03-22 DIAGNOSIS — K219 Gastro-esophageal reflux disease without esophagitis: Secondary | ICD-10-CM | POA: Diagnosis not present

## 2018-03-22 DIAGNOSIS — R350 Frequency of micturition: Secondary | ICD-10-CM | POA: Diagnosis not present

## 2018-03-22 DIAGNOSIS — R3915 Urgency of urination: Secondary | ICD-10-CM | POA: Diagnosis not present

## 2018-03-22 DIAGNOSIS — Z09 Encounter for follow-up examination after completed treatment for conditions other than malignant neoplasm: Secondary | ICD-10-CM | POA: Diagnosis not present

## 2018-03-22 DIAGNOSIS — Z87440 Personal history of urinary (tract) infections: Secondary | ICD-10-CM | POA: Diagnosis not present

## 2018-03-22 DIAGNOSIS — Z79899 Other long term (current) drug therapy: Secondary | ICD-10-CM | POA: Diagnosis not present

## 2018-05-22 DIAGNOSIS — E1165 Type 2 diabetes mellitus with hyperglycemia: Secondary | ICD-10-CM | POA: Insufficient documentation

## 2018-05-22 DIAGNOSIS — Z79899 Other long term (current) drug therapy: Secondary | ICD-10-CM | POA: Diagnosis not present

## 2018-05-22 DIAGNOSIS — R600 Localized edema: Secondary | ICD-10-CM | POA: Diagnosis not present

## 2018-05-22 DIAGNOSIS — Z0000 Encounter for general adult medical examination without abnormal findings: Secondary | ICD-10-CM | POA: Diagnosis not present

## 2018-05-22 DIAGNOSIS — R430 Anosmia: Secondary | ICD-10-CM | POA: Diagnosis not present

## 2018-05-22 DIAGNOSIS — R432 Parageusia: Secondary | ICD-10-CM | POA: Diagnosis not present

## 2018-05-22 DIAGNOSIS — G4700 Insomnia, unspecified: Secondary | ICD-10-CM | POA: Diagnosis not present

## 2018-05-22 DIAGNOSIS — G5712 Meralgia paresthetica, left lower limb: Secondary | ICD-10-CM | POA: Diagnosis not present

## 2018-06-01 ENCOUNTER — Other Ambulatory Visit: Payer: Self-pay | Admitting: Family Medicine

## 2018-06-01 DIAGNOSIS — Z1231 Encounter for screening mammogram for malignant neoplasm of breast: Secondary | ICD-10-CM

## 2018-06-13 ENCOUNTER — Ambulatory Visit
Admission: RE | Admit: 2018-06-13 | Discharge: 2018-06-13 | Disposition: A | Discharge status: Home / Self Care | Payer: Medicare HMO | Source: Ambulatory Visit | Attending: Family Medicine | Admitting: Family Medicine

## 2018-06-13 ENCOUNTER — Encounter (INDEPENDENT_AMBULATORY_CARE_PROVIDER_SITE_OTHER): Payer: Self-pay

## 2018-06-13 DIAGNOSIS — Z1231 Encounter for screening mammogram for malignant neoplasm of breast: Secondary | ICD-10-CM | POA: Insufficient documentation

## 2018-07-24 DIAGNOSIS — R69 Illness, unspecified: Secondary | ICD-10-CM | POA: Diagnosis not present

## 2018-08-31 DIAGNOSIS — R69 Illness, unspecified: Secondary | ICD-10-CM | POA: Diagnosis not present

## 2018-09-13 DIAGNOSIS — N390 Urinary tract infection, site not specified: Secondary | ICD-10-CM | POA: Diagnosis not present

## 2018-09-13 DIAGNOSIS — Z842 Family history of other diseases of the genitourinary system: Secondary | ICD-10-CM | POA: Diagnosis not present

## 2018-10-23 DIAGNOSIS — E1165 Type 2 diabetes mellitus with hyperglycemia: Secondary | ICD-10-CM | POA: Diagnosis not present

## 2018-10-23 DIAGNOSIS — R69 Illness, unspecified: Secondary | ICD-10-CM | POA: Diagnosis not present

## 2018-10-23 DIAGNOSIS — E119 Type 2 diabetes mellitus without complications: Secondary | ICD-10-CM | POA: Diagnosis not present

## 2019-05-28 ENCOUNTER — Other Ambulatory Visit: Payer: Self-pay | Admitting: Family Medicine

## 2019-05-28 DIAGNOSIS — Z1231 Encounter for screening mammogram for malignant neoplasm of breast: Secondary | ICD-10-CM

## 2019-06-19 ENCOUNTER — Ambulatory Visit
Admission: RE | Admit: 2019-06-19 | Discharge: 2019-06-19 | Disposition: A | Discharge status: Home / Self Care | Payer: Medicare HMO | Source: Ambulatory Visit | Attending: Family Medicine | Admitting: Family Medicine

## 2019-06-19 ENCOUNTER — Other Ambulatory Visit: Payer: Self-pay

## 2019-06-19 DIAGNOSIS — Z1231 Encounter for screening mammogram for malignant neoplasm of breast: Secondary | ICD-10-CM | POA: Diagnosis not present

## 2020-08-12 ENCOUNTER — Other Ambulatory Visit: Payer: Self-pay | Admitting: Family Medicine

## 2020-08-12 DIAGNOSIS — Z1231 Encounter for screening mammogram for malignant neoplasm of breast: Secondary | ICD-10-CM

## 2020-08-20 ENCOUNTER — Ambulatory Visit
Admission: RE | Admit: 2020-08-20 | Discharge: 2020-08-20 | Disposition: A | Discharge status: Home / Self Care | Payer: Medicare HMO | Source: Ambulatory Visit | Attending: Family Medicine | Admitting: Family Medicine

## 2020-08-20 ENCOUNTER — Other Ambulatory Visit: Payer: Self-pay

## 2020-08-20 DIAGNOSIS — Z1231 Encounter for screening mammogram for malignant neoplasm of breast: Secondary | ICD-10-CM | POA: Insufficient documentation

## 2020-09-21 ENCOUNTER — Ambulatory Visit: Payer: Medicare HMO

## 2021-02-08 DIAGNOSIS — Z6841 Body Mass Index (BMI) 40.0 and over, adult: Secondary | ICD-10-CM | POA: Insufficient documentation

## 2021-04-30 ENCOUNTER — Encounter: Payer: Self-pay | Admitting: Emergency Medicine

## 2021-04-30 ENCOUNTER — Ambulatory Visit
Admission: EM | Admit: 2021-04-30 | Discharge: 2021-04-30 | Disposition: A | Discharge status: Home / Self Care | Payer: Medicare HMO | Attending: Emergency Medicine | Admitting: Emergency Medicine

## 2021-04-30 ENCOUNTER — Other Ambulatory Visit: Payer: Self-pay

## 2021-04-30 DIAGNOSIS — R059 Cough, unspecified: Secondary | ICD-10-CM | POA: Diagnosis not present

## 2021-04-30 DIAGNOSIS — J029 Acute pharyngitis, unspecified: Secondary | ICD-10-CM | POA: Diagnosis not present

## 2021-04-30 DIAGNOSIS — Z20822 Contact with and (suspected) exposure to covid-19: Secondary | ICD-10-CM | POA: Insufficient documentation

## 2021-04-30 DIAGNOSIS — Z79899 Other long term (current) drug therapy: Secondary | ICD-10-CM | POA: Diagnosis not present

## 2021-04-30 LAB — SARS CORONAVIRUS 2 (TAT 6-24 HRS): SARS Coronavirus 2: NEGATIVE

## 2021-04-30 MED ORDER — BENZONATATE 200 MG PO CAPS: 200.0000 mg | ORAL_CAPSULE | Freq: Three times a day (TID) | ORAL | 0 refills | Status: DC | PRN

## 2021-04-30 MED ORDER — FLUTICASONE PROPIONATE 50 MCG/ACT NA SUSP: 2.0000 | Freq: Every day | NASAL | 0 refills | Status: DC

## 2021-04-30 NOTE — Discharge Instructions (Addendum)
I suspect that your cough could be from postnasal drip and/your, acid reflux.  Start saline nasal irrigation with a Milta Deiters Med rinse and distilled water as often as you want, Flonase, increase your GERD medication to a whole tablet every night.  Try some Claritin or Zyrtec.  COVID test will be back in 6 to 24 hours.  We will contact you if it is positive.  Tessalon will also help with the cough.

## 2021-04-30 NOTE — ED Provider Notes (Signed)
HPI  SUBJECTIVE:  Marilyn Hayes is a 75 y.o. female who presents with 6 days of a dry cough and sore throat starting yesterday.  She states that her symptoms started after attending a large event.  She states that she is unable to sleep secondary to the cough.  No fevers, bodyaches, headaches, change in her baseline nasal congestion or postnasal drip, rhinorrhea.  She states that she has been unable to smell for the past 7 or 8 months.  No loss of sense of taste.  No wheezing, shortness of breath, nausea, vomiting, diarrhea, abdominal pain.  No allergy or GERD symptoms.  She had a negative home COVID test 2 days ago.  No known COVID exposure.  She has gotten 2 doses of the COVID booster.  No antipyretic in the past 6 hours.  She was on antibiotics for UTI 3 weeks ago.  She has tried lozenges, over-the-counter cough medicine and ibuprofen.  The cough medicine and lozenges helped very temporarily.  Symptoms are worse with lying down.  She has a past medical history of GERD and takes one half dose of her prescribed medication, diabetes, is status post tonsillectomy.  No history of hypertension, COVID, chronic kidney disease, allergies.  PMD: [facility].    Past Medical History:  Diagnosis Date   Adenoma    Borderline diabetic    Diabetes mellitus without complication (HCC)    Edema    GERD (gastroesophageal reflux disease)    Nerve pain     Past Surgical History:  Procedure Laterality Date   colonscopy     HERNIA REPAIR     REPLACEMENT TOTAL KNEE     TONSILLECTOMY      Family History  Adopted: Yes  Problem Relation Age of Onset   Breast cancer Neg Hx    Kidney cancer Neg Hx    Kidney disease Neg Hx    Prostate cancer Neg Hx     Social History   Tobacco Use   Smoking status: Never   Smokeless tobacco: Never  Vaping Use   Vaping Use: Never used  Substance Use Topics   Alcohol use: Yes    Comment: social   Drug use: No    No current facility-administered  medications for this encounter.  Current Outpatient Medications:    benzonatate (TESSALON) 200 MG capsule, Take 1 capsule (200 mg total) by mouth 3 (three) times daily as needed for cough., Disp: 30 capsule, Rfl: 0   fluticasone (FLONASE) 50 MCG/ACT nasal spray, Place 2 sprays into both nostrils daily., Disp: 16 g, Rfl: 0   gabapentin (NEURONTIN) 400 MG capsule, Take 400 mg by mouth 3 (three) times daily., Disp: , Rfl:    glipiZIDE (GLUCOTROL XL) 5 MG 24 hr tablet, Take 1 tablet by mouth daily., Disp: , Rfl:    hydrochlorothiazide (HYDRODIURIL) 25 MG tablet, Take 25 mg by mouth daily., Disp: , Rfl:    lovastatin (MEVACOR) 40 MG tablet, SMARTSIG:1 Tablet(s) By Mouth Every Evening, Disp: , Rfl:    Omeprazole-Sodium Bicarbonate (ZEGERID) 20-1100 MG CAPS capsule, Take by mouth., Disp: , Rfl:    traZODone (DESYREL) 50 MG tablet, Take by mouth., Disp: , Rfl:    cefdinir (OMNICEF) 300 MG capsule, Take 1 capsule (300 mg total) by mouth 2 (two) times daily., Disp: 14 capsule, Rfl: 0   ibuprofen (ADVIL,MOTRIN) 200 MG tablet, Take 200 mg by mouth every 6 (six) hours as needed., Disp: , Rfl:    zolpidem (AMBIEN) 5 MG tablet,  Take 5 mg by mouth at bedtime as needed for sleep., Disp: , Rfl:   No Known Allergies   ROS  As noted in HPI.   Physical Exam  BP 127/90 (BP Location: Right Arm)   Pulse 75   Temp 98.6 F (37 C) (Oral)   Resp 14   Ht 5' 6.5" (1.689 m)   Wt 90.7 kg   SpO2 97%   BMI 31.80 kg/m   Constitutional: Well developed, well nourished, no acute distress Eyes:  EOMI, conjunctiva normal bilaterally HENT: Normocephalic, atraumatic,mucus membranes moist.  Normal turbinates.  Minimal nasal congestion.  No maxillary, frontal sinus tenderness.  Erythematous oropharynx, tonsils surgically absent.  No obvious postnasal drip. Neck: No cervical adenopathy Respiratory: Normal inspiratory effort, lungs clear bilaterally. Cardiovascular: Normal rate regular rhythm, no murmurs rubs or  gallops GI: nondistended skin: No rash, skin intact Musculoskeletal: no deformities Neurologic: Alert & oriented x 3, no focal neuro deficits Psychiatric: Speech and behavior appropriate   ED Course   Medications - No data to display  Orders Placed This Encounter  Procedures   SARS CORONAVIRUS 2 (TAT 6-24 HRS) Nasopharyngeal Nasopharyngeal Swab    Standing Status:   Standing    Number of Occurrences:   1    Order Specific Question:   Is this test for diagnosis or screening    Answer:   Diagnosis of ill patient    Order Specific Question:   Symptomatic for COVID-19 as defined by CDC    Answer:   Yes    Order Specific Question:   Date of Symptom Onset    Answer:   04/24/2021    Order Specific Question:   Hospitalized for COVID-19    Answer:   No    Order Specific Question:   Admitted to ICU for COVID-19    Answer:   No    Order Specific Question:   Previously tested for COVID-19    Answer:   Yes    Order Specific Question:   Resident in a congregate (group) care setting    Answer:   No    Order Specific Question:   Employed in healthcare setting    Answer:   No    Order Specific Question:   Pregnant    Answer:   No    Order Specific Question:   Has patient completed COVID vaccination(s) (2 doses of Pfizer/Moderna 1 dose of [employer])    Answer:   Yes    Order Specific Question:   Has patient completed COVID Booster / 3rd dose    Answer:   Yes    Results for orders placed or performed during the hospital encounter of 04/30/21 (from the past 24 hour(s))  SARS CORONAVIRUS 2 (TAT 6-24 HRS) Nasopharyngeal Nasopharyngeal Swab     Status: None   Collection Time: 04/30/21  8:48 AM   Specimen: Nasopharyngeal Swab  Result Value Ref Range   SARS Coronavirus 2 NEGATIVE NEGATIVE   No results found.  ED Clinical Impression  1. Cough   2. Sore throat      ED Assessment/Plan  Patient with a cough-suspect either postnasal drip or GERD.  Will check COVID due to recent  attendance at large gathering.  She will be out of the window for treatment.  Does not appear to be sinusitis, pneumonia, or other entity that would require imaging or antibiotics today.  We will start saline nasal irrigation Flonase, Tessalon, we will have her increase her GERD medication to  a whole tablet every night.  She is to start some Claritin or Zyrtec as well.  Return here or see her doctor if not getting any better, or if getting worse and we can reevaluate.  COVID negative.   Discussed labs,  MDM, treatment plan, and plan for follow-up with patient. . patient agrees with plan.   Meds ordered this encounter  Medications   fluticasone (FLONASE) 50 MCG/ACT nasal spray    Sig: Place 2 sprays into both nostrils daily.    Dispense:  16 g    Refill:  0   benzonatate (TESSALON) 200 MG capsule    Sig: Take 1 capsule (200 mg total) by mouth 3 (three) times daily as needed for cough.    Dispense:  30 capsule    Refill:  0       *This clinic note was created using [employer]. Therefore, there may be occasional mistakes despite careful proofreading.  ?    Melynda Ripple, MD 05/01/21 [phone]

## 2021-04-30 NOTE — ED Triage Notes (Addendum)
Patient c/o dry cough that started 5 days ago.  Patient also reports sneezing, runny nose, and scratchy throat. Patient denies fevers.  Patient states that she did a home covid test 2 days ago and was negative.

## 2021-05-01 ENCOUNTER — Telehealth: Payer: Self-pay | Admitting: Emergency Medicine

## 2021-05-01 NOTE — Telephone Encounter (Signed)
Returning call from patient regarding her COVID result.  Patient was notified that her COVID test was Negative.  Patient states that she is feeling much better today.  Patient to continue with her prescribed medications and to follow-up if her symptoms worsen.  Patient verbalized understanding.

## 2021-09-27 ENCOUNTER — Other Ambulatory Visit: Payer: Self-pay | Admitting: Family Medicine

## 2021-10-26 ENCOUNTER — Other Ambulatory Visit: Payer: Self-pay

## 2022-02-17 ENCOUNTER — Other Ambulatory Visit: Payer: Self-pay | Admitting: Family Medicine

## 2022-02-17 DIAGNOSIS — Z1231 Encounter for screening mammogram for malignant neoplasm of breast: Secondary | ICD-10-CM

## 2022-04-04 ENCOUNTER — Ambulatory Visit
Admission: RE | Admit: 2022-04-04 | Discharge: 2022-04-04 | Disposition: A | Discharge status: Home / Self Care | Payer: Medicare HMO | Source: Ambulatory Visit | Attending: Family Medicine | Admitting: Family Medicine

## 2022-04-04 DIAGNOSIS — Z1231 Encounter for screening mammogram for malignant neoplasm of breast: Secondary | ICD-10-CM | POA: Diagnosis not present

## 2022-09-07 IMAGING — MG MM DIGITAL SCREENING BILAT W/ TOMO AND CAD
8 series · 8 of 24 positions shown · non-contrast
Comparison: Previous exam(s).

ACR Breast Density Category a: The breast tissue is almost entirely
fatty.

CLINICAL DATA: Screening.

EXAM:
DIGITAL SCREENING BILATERAL MAMMOGRAM WITH TOMOSYNTHESIS AND CAD
TECHNIQUE: Bilateral screening digital craniocaudal and mediolateral oblique
mammograms were obtained. Bilateral screening digital breast
tomosynthesis was performed. The images were evaluated with
computer-aided detection.

[R CC synth-2D]
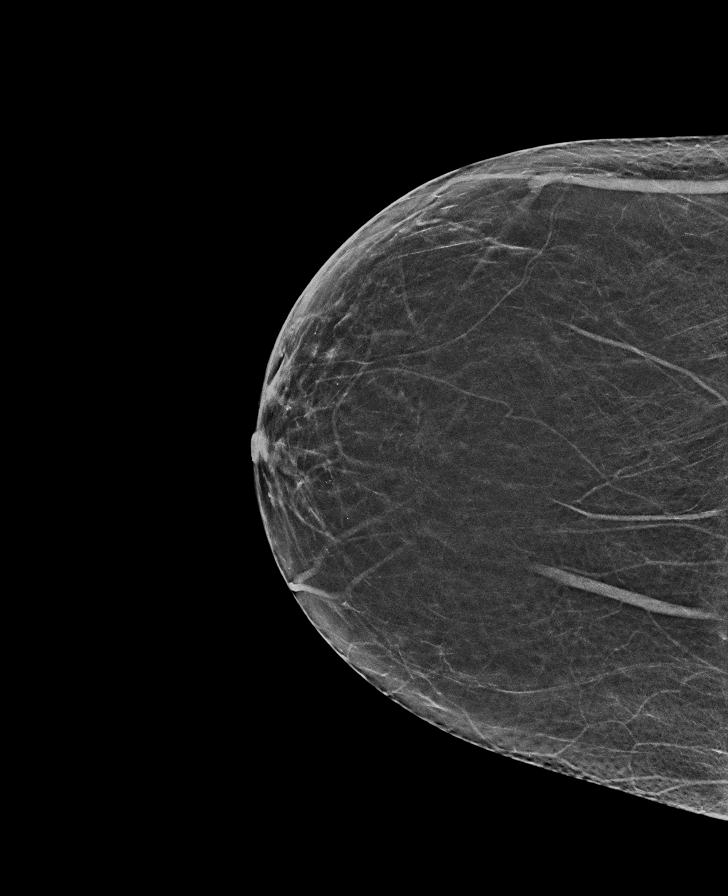

[L CC synth-2D]
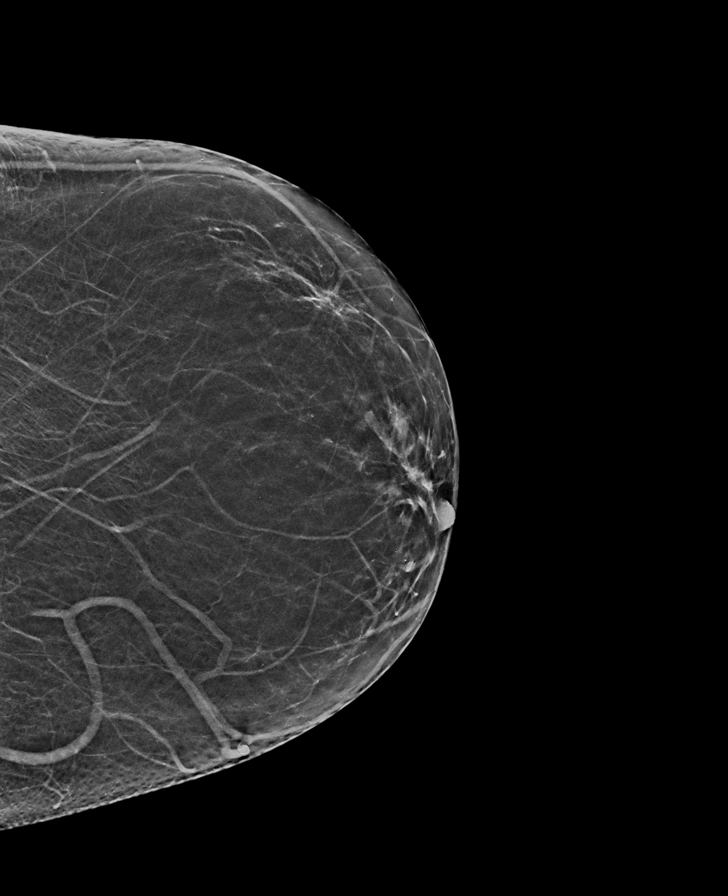

[R MLO synth-2D]
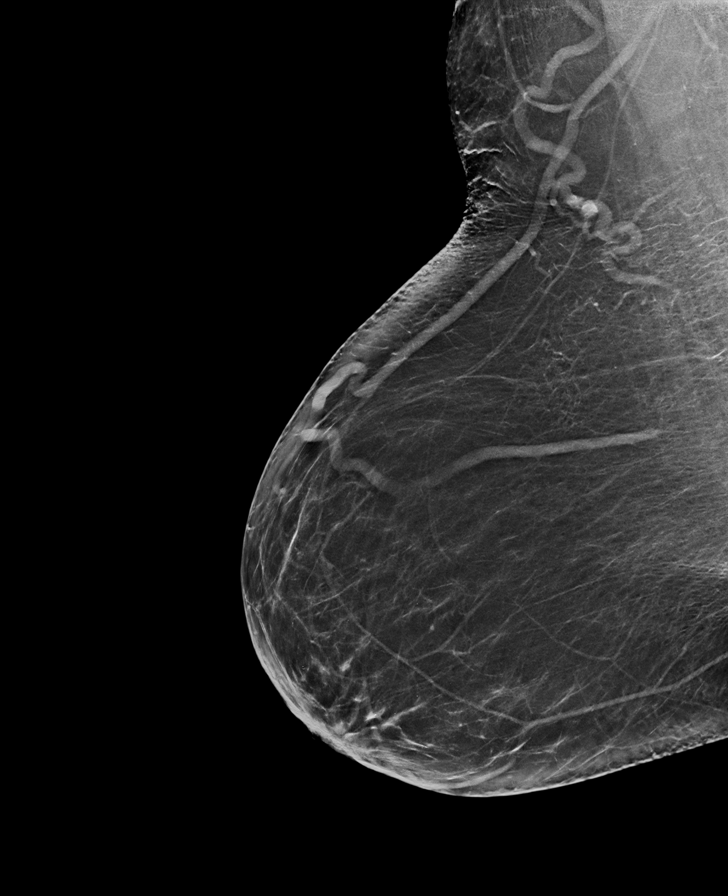

[L MLO synth-2D]
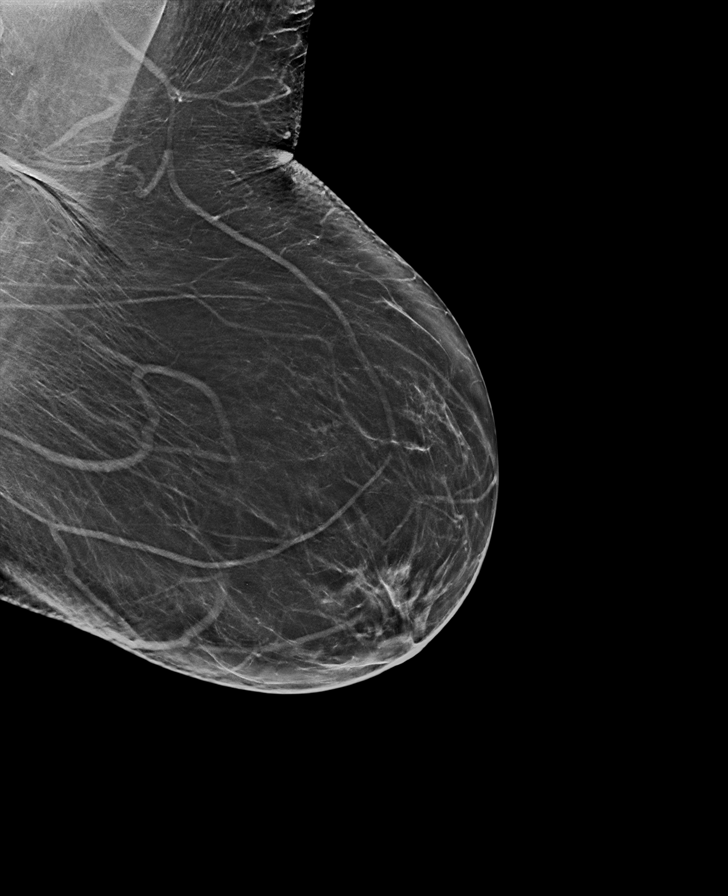

[L CC tomo · tomo slice 28/55.0]
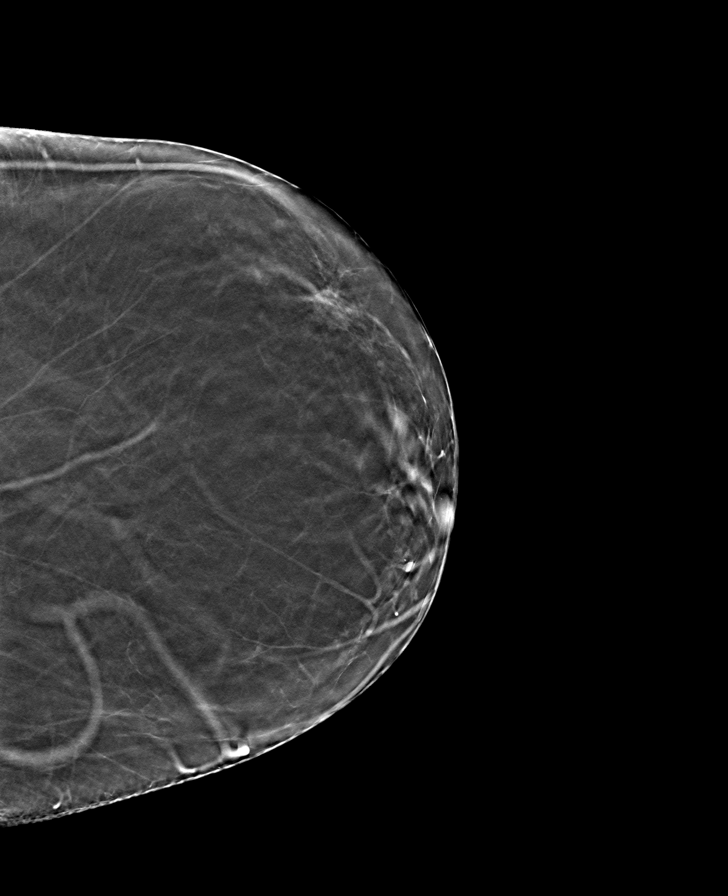

[R MLO tomo · tomo slice 37/74.0]
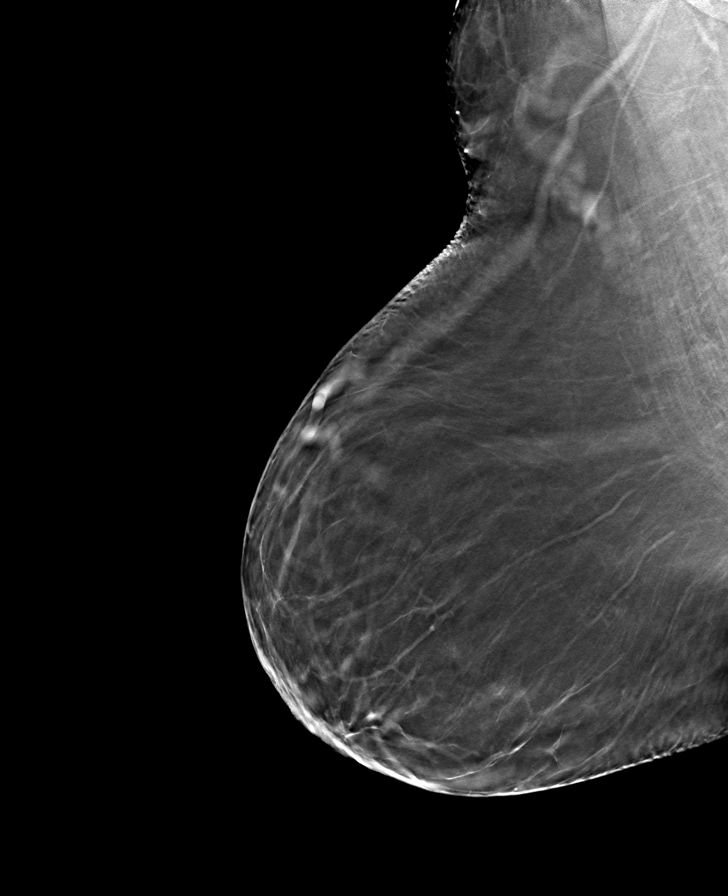

[L MLO tomo · tomo slice 37/74.0]
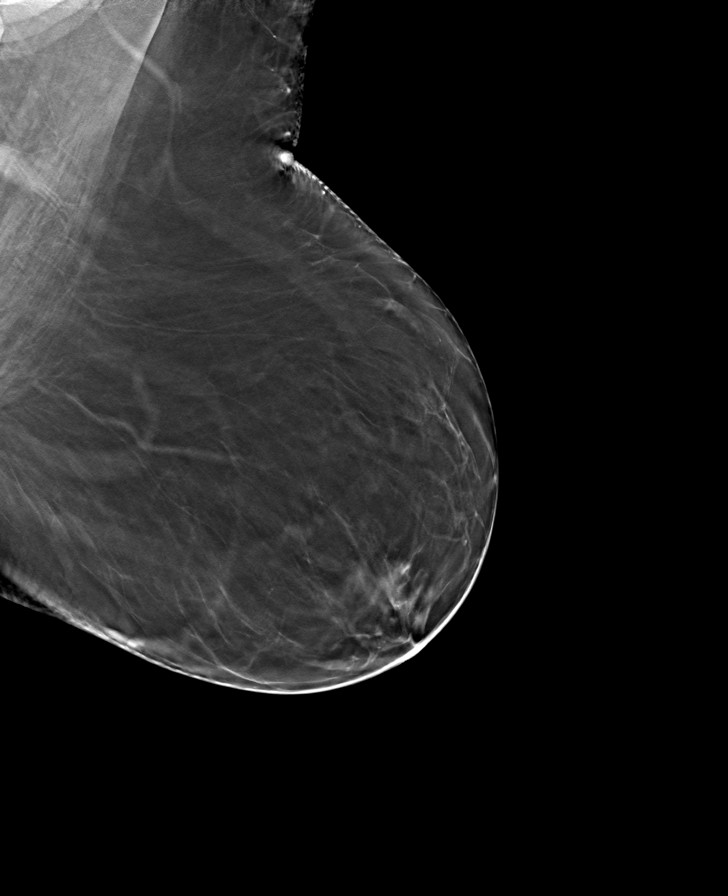

[R CC tomo · tomo slice 28/55.0]
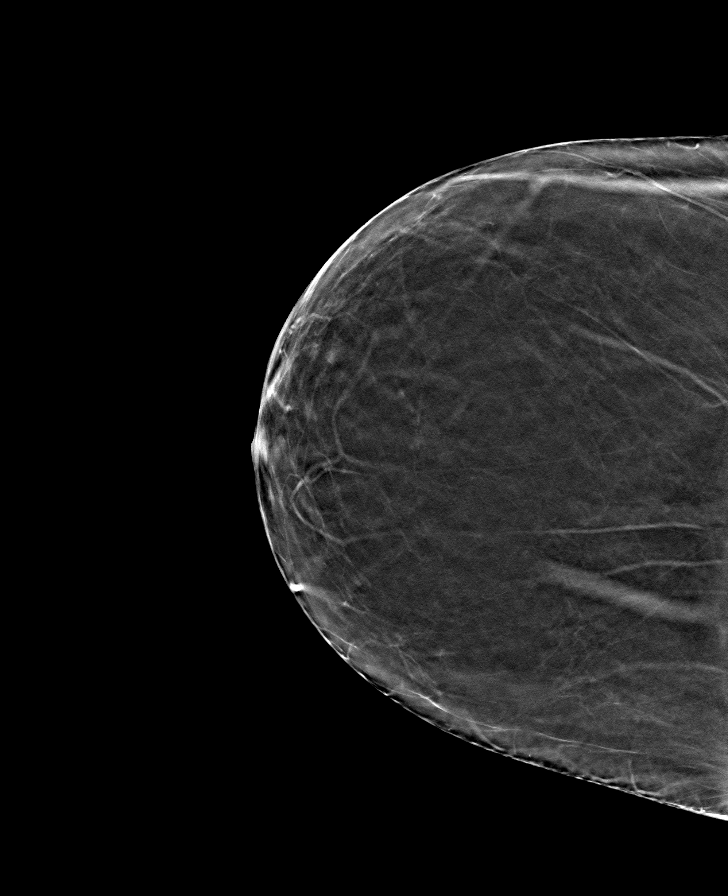

[8 of 24 positions shown; findings below may reference images not displayed]

FINDINGS: There are no findings suspicious for malignancy.
IMPRESSION: No mammographic evidence of malignancy. A result letter of this
screening mammogram will be mailed directly to the patient.

RECOMMENDATION:
Screening mammogram in one year. (Code:0E-3-N98)

BI-RADS CATEGORY  1: Negative.

## 2022-12-23 ENCOUNTER — Ambulatory Visit
Admission: RE | Admit: 2022-12-23 | Discharge: 2022-12-23 | Disposition: A | Discharge status: Home / Self Care | Payer: Medicare HMO | Source: Ambulatory Visit | Attending: Physician Assistant

## 2022-12-23 VITALS — BP 160/66 | HR 66 | Resp 14 | Ht 66.5 in | Wt 200.0 lb

## 2022-12-23 DIAGNOSIS — H6123 Impacted cerumen, bilateral: Secondary | ICD-10-CM | POA: Diagnosis not present

## 2022-12-23 NOTE — ED Provider Notes (Signed)
[facility]    CSN: [id number] Arrival date & time: 12/23/22  6073      History   Chief Complaint Chief Complaint  Patient presents with   Ear Fullness    Appointment    HPI Marilyn Hayes is a 77 y.o. female presenting for left ear discomfort and fullness for the past week.  She denies any pain.  She denies fever, cough, congestion, sinus pressure.  No headaches or dizziness reported.  No drainage from ear.  She believes they might need to be cleaned out.  No changes in her hearing and no other complaints.  HPI  Past Medical History:  Diagnosis Date   Adenoma    Borderline diabetic    Diabetes mellitus without complication (HCC)    Edema    GERD (gastroesophageal reflux disease)    Nerve pain     Patient Active Problem List   Diagnosis Date Noted   Dysuria 09/02/2017   Urinary tract infection with hematuria 09/02/2017   Yeast infection 09/02/2017   Hx of essential hypertension 09/02/2017   Elevated blood pressure reading 09/02/2017    Past Surgical History:  Procedure Laterality Date   colonscopy     HERNIA REPAIR     REPLACEMENT TOTAL KNEE     TONSILLECTOMY      OB History   No obstetric history on file.      Home Medications    Prior to Admission medications   Medication Sig Start Date End Date Taking? Authorizing Provider  cephALEXin (KEFLEX) 250 MG capsule Take 250 mg by mouth daily.   Yes [provider]  glipiZIDE (GLUCOTROL XL) 5 MG 24 hr tablet Take 1 tablet by mouth daily. 02/02/21  Yes [provider]  hydrochlorothiazide (HYDRODIURIL) 25 MG tablet Take 25 mg by mouth daily.   Yes [provider]  lovastatin (MEVACOR) 40 MG tablet SMARTSIG:1 Tablet(s) By Mouth Every Evening 02/18/21  Yes [provider]  fluticasone (FLONASE) 50 MCG/ACT nasal spray Place 2 sprays into both nostrils daily. 04/30/21   Melynda Ripple, MD  Omeprazole-Sodium Bicarbonate (ZEGERID) 20-1100 MG CAPS capsule Take by  mouth. 05/09/16   [provider]  traZODone (DESYREL) 50 MG tablet Take by mouth. 03/11/21 06/09/21  [provider]  zolpidem (AMBIEN) 5 MG tablet Take 5 mg by mouth at bedtime as needed for sleep.    [provider]    Family History Family History  Adopted: Yes  Problem Relation Age of Onset   Breast cancer Neg Hx    Kidney cancer Neg Hx    Kidney disease Neg Hx    Prostate cancer Neg Hx     Social History Social History   Tobacco Use   Smoking status: Never   Smokeless tobacco: Never  Vaping Use   Vaping Use: Never used  Substance Use Topics   Alcohol use: Yes    Comment: social   Drug use: No     Allergies   Patient has no known allergies.   Review of Systems Review of Systems  Constitutional:  Negative for fatigue and fever.  HENT:  Negative for congestion, ear discharge, ear pain, hearing loss, rhinorrhea, sinus pressure, sinus pain and sore throat.        +left ear fullness  Neurological:  Negative for dizziness and headaches.     Physical Exam Triage Vital Signs ED Triage Vitals  Enc Vitals Group     BP      Pulse  Resp      Temp      Temp src      SpO2      Weight      Height      Head Circumference      Peak Flow      Pain Score      Pain Loc      Pain Edu?      Excl. in [place]?    No data found.  Updated Vital Signs BP (!) 160/66 (BP Location: Left Arm)   Pulse 66   Resp 14   Ht 5' 6.5" (1.689 m)   Wt 199 lb 15.3 oz (90.7 kg)   SpO2 97%   BMI 31.79 kg/m     Physical Exam Vitals and nursing note reviewed.  Constitutional:      General: She is not in acute distress.    Appearance: Normal appearance. She is not ill-appearing or toxic-appearing.  HENT:     Head: Normocephalic and atraumatic.     Right Ear: External ear normal. There is impacted cerumen.     Left Ear: External ear normal. There is impacted cerumen.     Nose: Nose normal.     Mouth/Throat:     Mouth: Mucous membranes are moist.      Pharynx: Oropharynx is clear.  Eyes:     General: No scleral icterus.       Right eye: No discharge.        Left eye: No discharge.     Conjunctiva/sclera: Conjunctivae normal.  Cardiovascular:     Rate and Rhythm: Normal rate.  Pulmonary:     Effort: Pulmonary effort is normal. No respiratory distress.  Musculoskeletal:     Cervical back: Neck supple.  Skin:    General: Skin is dry.  Neurological:     General: No focal deficit present.     Mental Status: She is alert. Mental status is at baseline.     Motor: No weakness.     Gait: Gait normal.  Psychiatric:        Mood and Affect: Mood normal.        Behavior: Behavior normal.        Thought Content: Thought content normal.      UC Treatments / Results  Labs (all labs ordered are listed, but only abnormal results are displayed) Labs Reviewed - No data to display  EKG   Radiology No results found.  Procedures Procedures (including critical care time)  Medications Ordered in UC Medications - No data to display  Initial Impression / Assessment and Plan / UC Course  I have reviewed the triage vital signs and the nursing notes.  Pertinent labs & imaging results that were available during my care of the patient were reviewed by me and considered in my medical decision making (see chart for details).   77 year old female presenting for left ear fullness for the past week.  On exam she has impacted cerumen bilaterally.  Otic lavage performed by nursing staff and then I will reevaluate.  Otic lavage performed by nursing staff and successful.  Upon reexamination, no evidence of infection.  TM is normal-appearing.  Patient reports relief of ear fullness on the left.  Advised follow-up as needed.  Final Clinical Impressions(s) / UC Diagnoses   Final diagnoses:  Bilateral impacted cerumen   Discharge Instructions   None    ED Prescriptions   None    PDMP not reviewed this encounter.  Danton Clap,  PA-C 12/23/22 1122

## 2022-12-23 NOTE — ED Triage Notes (Signed)
Patient c/o left ear discomfort and fullness that started over a week ago. Patient denies any cold symptoms.  Patient denies fevers.

## 2024-03-26 ENCOUNTER — Ambulatory Visit
Admission: RE | Admit: 2024-03-26 | Discharge: 2024-03-26 | Disposition: A | Discharge status: Home / Self Care | Source: Ambulatory Visit

## 2024-03-26 VITALS — BP 142/103 | HR 73 | Temp 98.9°F | Resp 16 | Ht 66.0 in | Wt 195.0 lb

## 2024-03-26 DIAGNOSIS — H6123 Impacted cerumen, bilateral: Secondary | ICD-10-CM

## 2024-03-26 NOTE — ED Provider Notes (Signed)
 [facility]    CSN: [id number] Arrival date & time: 03/26/24  1613      History   Chief Complaint Chief Complaint  Patient presents with   Ear Fullness    HPI Marilyn Hayes is a 78 y.o. female presenting for left ear discomfort and fullness for the past day.  She denies any pain.  She denies fever, cough, congestion, sinus pressure.  No headaches or dizziness reported.  No drainage from ear.  She believes they might need to be cleaned out.  No changes in her hearing and no other complaints.  HPI  Past Medical History:  Diagnosis Date   Adenoma    Borderline diabetic    Diabetes mellitus without complication (HCC)    Edema    GERD (gastroesophageal reflux disease)    Nerve pain     Patient Active Problem List   Diagnosis Date Noted   Dysuria 09/02/2017   Urinary tract infection with hematuria 09/02/2017   Yeast infection 09/02/2017   Hx of essential hypertension 09/02/2017   Elevated blood pressure reading 09/02/2017    Past Surgical History:  Procedure Laterality Date   colonscopy     HERNIA REPAIR     REPLACEMENT TOTAL KNEE     TONSILLECTOMY      OB History   No obstetric history on file.      Home Medications    Prior to Admission medications   Medication Sig Start Date End Date Taking? Authorizing Provider  atorvastatin (LIPITOR) 20 MG tablet Take 20 mg by mouth daily.   Yes [provider]  cephALEXin  (KEFLEX ) 250 MG capsule Take 250 mg by mouth daily.   Yes [provider]  glipiZIDE (GLUCOTROL XL) 5 MG 24 hr tablet Take 1 tablet by mouth daily. 02/02/21  Yes [provider]  hydrochlorothiazide (HYDRODIURIL) 25 MG tablet Take 25 mg by mouth daily.   Yes [provider]  lovastatin (MEVACOR) 40 MG tablet SMARTSIG:1 Tablet(s) By Mouth Every Evening 02/18/21  Yes [provider]  Omeprazole-Sodium Bicarbonate (ZEGERID) 20-1100 MG CAPS capsule Take by mouth. 05/09/16  Yes [provider]  spironolactone (ALDACTONE) 25 MG tablet Take 25 mg by mouth daily.   Yes [provider]  traZODone (DESYREL) 50 MG tablet Take by mouth. 03/11/21 03/26/24 Yes [provider]  zolpidem (AMBIEN) 5 MG tablet Take 5 mg by mouth at bedtime as needed for sleep.   Yes [provider]  fluticasone  (FLONASE ) 50 MCG/ACT nasal spray Place 2 sprays into both nostrils daily. 04/30/21   Ethlyn Herd, MD    Family History Family History  Adopted: Yes  Problem Relation Age of Onset   Breast cancer Neg Hx    Kidney cancer Neg Hx    Kidney disease Neg Hx    Prostate cancer Neg Hx     Social History Social History   Tobacco Use   Smoking status: Never   Smokeless tobacco: Never  Vaping Use   Vaping status: Never Used  Substance Use Topics   Alcohol use: Yes    Comment: social   Drug use: No     Allergies   Patient has no known allergies.   Review of Systems Review of Systems  Constitutional:  Negative for fatigue and fever.  HENT:  Negative for congestion, ear discharge, ear pain, hearing loss, rhinorrhea, sinus pressure, sinus pain and sore throat.        +left ear fullness  Neurological:  Negative for  dizziness and headaches.     Physical Exam Triage Vital Signs ED Triage Vitals  Enc Vitals Group     BP      Pulse      Resp      Temp      Temp src      SpO2      Weight      Height      Head Circumference      Peak Flow      Pain Score      Pain Loc      Pain Edu?      Excl. in [place]?    No data found.  Updated Vital Signs BP (!) 142/103 (BP Location: Left Arm)   Pulse 73   Temp 98.9 F (37.2 C) (Oral)   Resp 16   Ht 5\' 6"  (1.676 m)   Wt 195 lb (88.5 kg)   SpO2 95%   BMI 31.47 kg/m     Physical Exam Vitals and nursing note reviewed.  Constitutional:      General: She is not in acute distress.    Appearance: Normal appearance. She is not ill-appearing or toxic-appearing.  HENT:     Head: Normocephalic and atraumatic.      Right Ear: External ear normal. There is impacted cerumen.     Left Ear: External ear normal. There is impacted cerumen.     Nose: Nose normal.     Mouth/Throat:     Mouth: Mucous membranes are moist.     Pharynx: Oropharynx is clear.  Eyes:     General: No scleral icterus.       Right eye: No discharge.        Left eye: No discharge.     Conjunctiva/sclera: Conjunctivae normal.  Cardiovascular:     Rate and Rhythm: Normal rate.  Pulmonary:     Effort: Pulmonary effort is normal. No respiratory distress.  Musculoskeletal:     Cervical back: Neck supple.  Skin:    General: Skin is dry.  Neurological:     General: No focal deficit present.     Mental Status: She is alert. Mental status is at baseline.     Motor: No weakness.     Gait: Gait normal.  Psychiatric:        Mood and Affect: Mood normal.        Behavior: Behavior normal.        Thought Content: Thought content normal.      UC Treatments / Results  Labs (all labs ordered are listed, but only abnormal results are displayed) Labs Reviewed - No data to display  EKG   Radiology No results found.  Procedures Procedures (including critical care time)  Medications Ordered in UC Medications - No data to display  Initial Impression / Assessment and Plan / UC Course  I have reviewed the triage vital signs and the nursing notes.  Pertinent labs & imaging results that were available during my care of the patient were reviewed by me and considered in my medical decision making (see chart for details).   78 year old female presenting for left ear fullness for the past day.  On exam she has impacted cerumen bilaterally.  Otic lavage performed by nursing staff and then I will reevaluate.  Otic lavage performed by nursing staff and successful.  Upon reexamination, no evidence of infection.  TM is normal-appearing.  Patient reports relief of ear fullness on the left.  Advised follow-up as  needed.  Final Clinical  Impressions(s) / UC Diagnoses   Final diagnoses:  Bilateral impacted cerumen   Discharge Instructions   None    ED Prescriptions   None    PDMP not reviewed this encounter.     Floydene Hy, PA-C 03/26/24 [phone]

## 2024-03-26 NOTE — ED Triage Notes (Signed)
 Pt c/o L ear fullness & itchiness x1 day.

## 2024-10-25 ENCOUNTER — Ambulatory Visit: Admission: EM | Admit: 2024-10-25 | Discharge: 2024-10-25 | Disposition: A | Discharge status: Home / Self Care

## 2024-10-25 DIAGNOSIS — H6122 Impacted cerumen, left ear: Secondary | ICD-10-CM

## 2024-10-25 NOTE — Discharge Instructions (Signed)
 Pt left after ear irrigation, no discharge instructions given

## 2024-10-25 NOTE — ED Provider Notes (Signed)
 [facility]    CSN: [id number] Arrival date & time: 10/25/24  1551      History   Chief Complaint Chief Complaint  Patient presents with   Ear Problem    HPI Marilyn Hayes is a 78 y.o. female.   78 year old female, Marilyn Hayes, presents to urgent care for evaluation ofvibrations in my left ear started yesterday, pt denies injury or trauma. Pt states she had this in past and was given pills ~ 15 years ago , but unsure of name.  The history is provided by the patient. No language interpreter was used.    Past Medical History:  Diagnosis Date   Adenoma    Borderline diabetic    Diabetes mellitus without complication (HCC)    Edema    GERD (gastroesophageal reflux disease)    Nerve pain     Patient Active Problem List   Diagnosis Date Noted   Left ear impacted cerumen 10/25/2024   Dysuria 09/02/2017   Urinary tract infection with hematuria 09/02/2017   Yeast infection 09/02/2017   Hx of essential hypertension 09/02/2017   Elevated blood pressure reading 09/02/2017    Past Surgical History:  Procedure Laterality Date   colonscopy     HERNIA REPAIR     REPLACEMENT TOTAL KNEE     TONSILLECTOMY      OB History   No obstetric history on file.      Home Medications    Prior to Admission medications   Medication Sig Start Date End Date Taking? Authorizing Provider  atorvastatin (LIPITOR) 20 MG tablet Take 20 mg by mouth daily.    [provider]  cephALEXin  (KEFLEX ) 250 MG capsule Take 250 mg by mouth daily.    [provider]  fluticasone  (FLONASE ) 50 MCG/ACT nasal spray Place 2 sprays into both nostrils daily. 04/30/21   Mortenson, Ashley, MD  glipiZIDE (GLUCOTROL XL) 5 MG 24 hr tablet Take 1 tablet by mouth daily. 02/02/21   [provider]  hydrochlorothiazide (HYDRODIURIL) 25 MG tablet Take 25 mg by mouth daily.    [provider]  lovastatin (MEVACOR) 40 MG tablet SMARTSIG:1 Tablet(s) By Mouth  Every Evening 02/18/21   [provider]  Omeprazole-Sodium Bicarbonate (ZEGERID) 20-1100 MG CAPS capsule Take by mouth. 05/09/16   [provider]  spironolactone (ALDACTONE) 25 MG tablet Take 25 mg by mouth daily.    [provider]  traZODone (DESYREL) 50 MG tablet Take by mouth. 03/11/21 03/26/24  [provider]  zolpidem (AMBIEN) 5 MG tablet Take 5 mg by mouth at bedtime as needed for sleep.    [provider]    Family History Family History  Adopted: Yes  Problem Relation Age of Onset   Breast cancer Neg Hx    Kidney cancer Neg Hx    Kidney disease Neg Hx    Prostate cancer Neg Hx     Social History Social History   Tobacco Use   Smoking status: Never   Smokeless tobacco: Never  Vaping Use   Vaping status: Never Used  Substance Use Topics   Alcohol use: Yes    Comment: social   Drug use: No     Allergies   Patient has no known allergies.   Review of Systems Review of Systems  Constitutional:  Negative for fever.  HENT:  Negative for ear discharge and ear pain.        Left ear vibrations  Respiratory:  Negative for cough.  All other systems reviewed and are negative.    Physical Exam Triage Vital Signs ED Triage Vitals  Encounter Vitals Group     BP 10/25/24 1606 (!) 140/79     Girls Systolic BP Percentile --      Girls Diastolic BP Percentile --      Boys Systolic BP Percentile --      Boys Diastolic BP Percentile --      Pulse Rate 10/25/24 1606 70     Resp 10/25/24 1606 18     Temp 10/25/24 1606 98.5 F (36.9 C)     Temp Source 10/25/24 1606 Oral     SpO2 10/25/24 1606 96 %     Weight --      Height --      Head Circumference --      Peak Flow --      Pain Score 10/25/24 1604 0     Pain Loc --      Pain Education --      Exclude from Growth Chart --    No data found.  Updated Vital Signs BP (!) 140/79 (BP Location: Right Arm)   Pulse 70   Temp 98.5 F (36.9 C) (Oral)   Resp 18   SpO2 96%    Visual Acuity Right Eye Distance:   Left Eye Distance:   Bilateral Distance:    Right Eye Near:   Left Eye Near:    Bilateral Near:     Physical Exam Vitals and nursing note reviewed.  HENT:     Head: Normocephalic.     Right Ear: Tympanic membrane normal. There is impacted cerumen.     Left Ear: Tympanic membrane normal.     Ears:     Comments: Partial cerumen impaction in left ear    Nose: Nose normal.  Cardiovascular:     Rate and Rhythm: Normal rate.  Pulmonary:     Effort: Pulmonary effort is normal.  Neurological:     General: No focal deficit present.     Mental Status: She is alert and oriented to person, place, and time.     GCS: GCS eye subscore is 4. GCS verbal subscore is 5. GCS motor subscore is 6.  Psychiatric:        Attention and Perception: Attention normal.        Mood and Affect: Mood is anxious.        Speech: Speech normal.      UC Treatments / Results  Labs (all labs ordered are listed, but only abnormal results are displayed) Labs Reviewed - No data to display  EKG   Radiology No results found.  Procedures Procedures (including critical care time)  Medications Ordered in UC Medications - No data to display  Initial Impression / Assessment and Plan / UC Course  I have reviewed the triage vital signs and the nursing notes.  Pertinent labs & imaging results that were available during my care of the patient were reviewed by me and considered in my medical decision making (see chart for details).    Patient evaluated, left ear irrigation ordered, per CMA cerumen removed via irrigation and patient walked out the door, patient did not wait for instructions for reevaluation.   Ddx: Left ear cerumen impaction, tinnitus,allergies,viral illness Final Clinical Impressions(s) / UC Diagnoses   Final diagnoses:  Left ear impacted cerumen     Discharge Instructions      Pt left after ear irrigation, no discharge instructions  given    ED Prescriptions   None    PDMP not reviewed this encounter.   Aminta Loose, NP 10/25/24 2001

## 2024-10-25 NOTE — ED Triage Notes (Addendum)
 Humming in the left ear, started yesterday  No noise but just a vibration  Had this a few years ago and was given some pills to take and it away --15 years ago States that it is not tinnitus

## 2024-12-04 ENCOUNTER — Ambulatory Visit
Admission: EM | Admit: 2024-12-04 | Discharge: 2024-12-04 | Disposition: A | Discharge status: Home / Self Care | Source: Home / Self Care

## 2024-12-04 DIAGNOSIS — M1990 Unspecified osteoarthritis, unspecified site: Secondary | ICD-10-CM | POA: Insufficient documentation

## 2024-12-04 DIAGNOSIS — R6889 Other general symptoms and signs: Secondary | ICD-10-CM

## 2024-12-04 DIAGNOSIS — Z87440 Personal history of urinary (tract) infections: Secondary | ICD-10-CM | POA: Insufficient documentation

## 2024-12-04 DIAGNOSIS — M797 Fibromyalgia: Secondary | ICD-10-CM | POA: Insufficient documentation

## 2024-12-04 DIAGNOSIS — G4700 Insomnia, unspecified: Secondary | ICD-10-CM | POA: Insufficient documentation

## 2024-12-04 DIAGNOSIS — E119 Type 2 diabetes mellitus without complications: Secondary | ICD-10-CM | POA: Insufficient documentation

## 2024-12-04 DIAGNOSIS — Z7984 Long term (current) use of oral hypoglycemic drugs: Secondary | ICD-10-CM | POA: Insufficient documentation

## 2024-12-04 DIAGNOSIS — K635 Polyp of colon: Secondary | ICD-10-CM | POA: Insufficient documentation

## 2024-12-04 DIAGNOSIS — R600 Localized edema: Secondary | ICD-10-CM | POA: Insufficient documentation

## 2024-12-04 DIAGNOSIS — M545 Low back pain, unspecified: Secondary | ICD-10-CM | POA: Insufficient documentation

## 2024-12-04 DIAGNOSIS — K219 Gastro-esophageal reflux disease without esophagitis: Secondary | ICD-10-CM | POA: Insufficient documentation

## 2024-12-04 DIAGNOSIS — N3000 Acute cystitis without hematuria: Secondary | ICD-10-CM

## 2024-12-04 DIAGNOSIS — I10 Essential (primary) hypertension: Secondary | ICD-10-CM | POA: Insufficient documentation

## 2024-12-04 DIAGNOSIS — B9620 Unspecified Escherichia coli [E. coli] as the cause of diseases classified elsewhere: Secondary | ICD-10-CM | POA: Insufficient documentation

## 2024-12-04 LAB — POCT URINE DIPSTICK
Glucose, UA: NEGATIVE mg/dL
Nitrite, UA: POSITIVE — AB
Protein Ur, POC: >=300 mg/dL — AB
Spec Grav, UA: 1.025
Urobilinogen, UA: 1 U/dL
pH, UA: 5.5

## 2024-12-04 LAB — POC COVID19/FLU A&B COMBO
Covid Antigen, POC: NEGATIVE
Influenza A Antigen, POC: NEGATIVE
Influenza B Antigen, POC: NEGATIVE

## 2024-12-04 MED ORDER — PHENAZOPYRIDINE HCL 200 MG PO TABS: 200.0000 mg | ORAL_TABLET | Freq: Three times a day (TID) | ORAL | 0 refills | Status: AC

## 2024-12-04 MED ORDER — CEFDINIR 300 MG PO CAPS: 300.0000 mg | ORAL_CAPSULE | Freq: Two times a day (BID) | ORAL | 0 refills | Status: DC

## 2024-12-04 NOTE — ED Provider Notes (Addendum)
 " [facility]    CSN: [id number] Arrival date & time: 12/04/24  1321      History   Chief Complaint Chief Complaint  Patient presents with   Shaking    HPI Marilyn Hayes is a 79 y.o. female.   HPI  79 year old female with past medical history significant for GERD, diabetes, essential hypertension, and recurrent UTIs presents for evaluation of feeling cold and rigors that developed last night while she was at home.  She reports that she has increased urinary frequency at baseline but denies any pain with urination, urgency, or hematuria.  No upper or lower respiratory symptoms.  Past Medical History:  Diagnosis Date   Adenoma    Borderline diabetic    Diabetes mellitus without complication (HCC)    Edema    GERD (gastroesophageal reflux disease)    Nerve pain     Patient Active Problem List   Diagnosis Date Noted   Edema of both legs 12/04/2024   Fibromyalgia 12/04/2024   GERD (gastroesophageal reflux disease) 12/04/2024   Hyperplastic colon polyp 12/04/2024   Insomnia 12/04/2024   Low back pain 12/04/2024   Osteoarthritis 12/04/2024   Left ear impacted cerumen 10/25/2024   Body mass index (BMI) 40.0-44.9, adult (HCC) 02/08/2021   Type 2 diabetes mellitus with hyperglycemia, without long-term current use of insulin  (HCC) 05/22/2018   Dysuria 09/02/2017   Recurrent UTI 09/02/2017   Yeast infection 09/02/2017   Hx of essential hypertension 09/02/2017   Elevated blood pressure reading 09/02/2017   Type 2 diabetes mellitus without complication, without long-term current use of insulin  (HCC) 05/17/2017   Presence of right artificial knee joint 06/19/2015   Status post right knee replacement 06/03/2015   Primary osteoarthritis of right knee 08/28/2014   Right shoulder pain 04/04/2014   Tinnitus 08/20/2013   Primary localized osteoarthrosis, lower leg 02/20/2012    Past Surgical History:  Procedure Laterality Date   colonscopy     HERNIA REPAIR      REPLACEMENT TOTAL KNEE     TONSILLECTOMY      OB History   No obstetric history on file.      Home Medications    Prior to Admission medications  Medication Sig Start Date End Date Taking? Authorizing Provider  cefdinir  (OMNICEF ) 300 MG capsule Take 1 capsule (300 mg total) by mouth 2 (two) times daily for 7 days. 12/04/24 12/11/24 Yes Bernardino Ditch, NP  phenazopyridine  (PYRIDIUM ) 200 MG tablet Take 1 tablet (200 mg total) by mouth 3 (three) times daily. 12/04/24  Yes Bernardino Ditch, NP  atorvastatin  (LIPITOR) 20 MG tablet Take 20 mg by mouth daily.    [provider]  glipiZIDE (GLUCOTROL XL) 5 MG 24 hr tablet Take 1 tablet by mouth daily. 02/02/21   [provider]  hydrochlorothiazide (HYDRODIURIL) 25 MG tablet Take 25 mg by mouth daily.    [provider]  lovastatin (MEVACOR) 40 MG tablet SMARTSIG:1 Tablet(s) By Mouth Every Evening 02/18/21   [provider]  Omeprazole-Sodium Bicarbonate (ZEGERID) 20-1100 MG CAPS capsule Take by mouth. 05/09/16   [provider]  spironolactone (ALDACTONE) 25 MG tablet Take 25 mg by mouth daily.    [provider]  traZODone (DESYREL) 50 MG tablet Take by mouth. 03/11/21 03/26/24  [provider]  zolpidem  (AMBIEN ) 5 MG tablet Take 5 mg by mouth at bedtime as needed for sleep.    [provider]    Family History Family History  Adopted: Yes  Problem Relation Age of Onset   Breast cancer Neg Hx    Kidney cancer Neg Hx    Kidney disease Neg Hx    Prostate cancer Neg Hx     Social History Social History[1]   Allergies   Patient has no known allergies.   Review of Systems Review of Systems  Constitutional:  Negative for fever.  HENT:  Negative for congestion, ear pain and rhinorrhea.   Respiratory:  Negative for cough.   Genitourinary:  Positive for frequency. Negative for dysuria, hematuria and urgency.     Physical Exam Triage Vital Signs ED Triage Vitals   Encounter Vitals Group     BP --      Girls Systolic BP Percentile --      Girls Diastolic BP Percentile --      Boys Systolic BP Percentile --      Boys Diastolic BP Percentile --      Pulse --      Resp --      Temp --      Temp src --      SpO2 --      Weight 12/04/24 1341 185 lb (83.9 kg)     Height --      Head Circumference --      Peak Flow --      Pain Score 12/04/24 1344 0     Pain Loc --      Pain Education --      Exclude from Growth Chart --    No data found.  Updated Vital Signs BP 95/61 (BP Location: Left Arm)   Pulse 87   Temp 99.8 F (37.7 C) (Oral)   Resp 16   Wt 185 lb (83.9 kg)   SpO2 94%   BMI 29.86 kg/m   Visual Acuity Right Eye Distance:   Left Eye Distance:   Bilateral Distance:    Right Eye Near:   Left Eye Near:    Bilateral Near:     Physical Exam Vitals and nursing note reviewed.  Constitutional:      Appearance: Normal appearance. She is not ill-appearing.  HENT:     Head: Normocephalic and atraumatic.  Cardiovascular:     Rate and Rhythm: Normal rate.     Pulses: Normal pulses.     Heart sounds: Normal heart sounds. No murmur heard.    No friction rub. No gallop.  Pulmonary:     Effort: Pulmonary effort is normal.     Breath sounds: Normal breath sounds. No wheezing, rhonchi or rales.  Abdominal:     Tenderness: There is no right CVA tenderness or left CVA tenderness.  Skin:    General: Skin is warm and dry.     Capillary Refill: Capillary refill takes less than 2 seconds.     Findings: No rash.  Neurological:     General: No focal deficit present.     Mental Status: She is alert and oriented to person, place, and time.      UC Treatments / Results  Labs (all labs ordered are listed, but only abnormal results are displayed) Labs Reviewed  POCT URINE DIPSTICK - Abnormal; Notable for the following components:      Result Value   Color, UA straw (*)    Clarity, UA cloudy (*)    Bilirubin, UA small (*)     Ketones, POC UA small (15) (*)    Blood, UA large (*)    Protein Ur, POC >=  300 (*)    Nitrite, UA Positive (*)    Leukocytes, UA Large (3+) (*)    All other components within normal limits  POC COVID19/FLU A&B COMBO - Normal  URINE CULTURE    EKG   Radiology No results found.  Procedures Procedures (including critical care time)  Medications Ordered in UC Medications - No data to display  Initial Impression / Assessment and Plan / UC Course  I have reviewed the triage vital signs and the nursing notes.  Pertinent labs & imaging results that were available during my care of the patient were reviewed by me and considered in my medical decision making (see chart for details).   Patient is a nontoxic-appearing 79 year old female presenting for evaluation of what sounds like rigors that occurred last night and resolved after she went to bed.  They then returned this morning so she came in for evaluation.  She denies any upper or lower respiratory symptoms and she denies any urinary symptoms though she does have urinary frequency at baseline.  No urgency or dysuria.  Also no hematuria.  She does have a mildly elevated temp of 99.8.  She also reports that her fasting blood sugar was 187 this morning which is slightly higher than it is usually.  Given her history of recurrent UTIs I will order a urinalysis.  I will also order a COVID and flu antigen test despite the fact that she is not experiencing any respiratory symptoms to search for other sources of infection.  Respiratory antigen panel is negative for COVID or influenza.  Urinalysis has a straw color and cloudy clarity with small bilirubin, small ketones, large RBCs, greater than 300 protein, nitrate positive with 3+ leukocyte esterase.  I will send urine for culture.  I will discharge her home with a diagnosis of UTI and start her on cefdinir  300 mg twice daily for 7 days to cover for her UTI.  I will have her continue to monitor her  temperature at home and use Tylenol  and/or ibuprofen as needed for any fever.  Return and ER precautions reviewed.  Urine culture grew out positive for E. coli with pan sensitivity.  The antibiotic did not make it to the pharmacy.  The patient was subsequently admitted to [facility] for UTI the following day.   Final Clinical Impressions(s) / UC Diagnoses   Final diagnoses:  Rigors  Acute cystitis without hematuria     Discharge Instructions      Take the cefdinir  twice daily for 7 days with food for treatment of urinary tract infection.  Use the Pyridium  every 8 hours as needed for urinary discomfort.  This will turn your urine a bright red-orange.  Increase your oral fluid intake so that you increase your urine production and or flushing your urinary system.  Take an over-the-counter probiotic, such as Culturelle-Align-Activia, 1 hour after each dose of antibiotic to prevent diarrhea or yeast infections from forming.  We will culture urine and change the antibiotics if necessary.  Return for reevaluation, or see your primary care provider, for any new or worsening symptoms.  Continue to monitor your temperature at home.  If you develop a fever that does not respond to Tylenol  or ibuprofen, worsening shaking chills, nausea or vomiting or you cannot keep down medication or fluids then you need to go to the ER for evaluation.     ED Prescriptions     Medication Sig Dispense Auth. Provider   cefdinir  (OMNICEF ) 300 MG capsule Take 1  capsule (300 mg total) by mouth 2 (two) times daily for 7 days. 14 capsule Bernardino Ditch, NP   phenazopyridine  (PYRIDIUM ) 200 MG tablet Take 1 tablet (200 mg total) by mouth 3 (three) times daily. 6 tablet Bernardino Ditch, NP      PDMP not reviewed this encounter.    Bernardino Ditch, NP 12/04/24 1438     [1]  Social History Tobacco Use   Smoking status: Never   Smokeless tobacco: Never  Vaping Use   Vaping status: Never Used  Substance Use Topics    Alcohol use: Yes    Comment: social   Drug use: No     Bernardino Ditch, NP 12/09/24 1028  "

## 2024-12-04 NOTE — Discharge Instructions (Addendum)
 Take the cefdinir  twice daily for 7 days with food for treatment of urinary tract infection.  Use the Pyridium  every 8 hours as needed for urinary discomfort.  This will turn your urine a bright red-orange.  Increase your oral fluid intake so that you increase your urine production and or flushing your urinary system.  Take an over-the-counter probiotic, such as Culturelle-Align-Activia, 1 hour after each dose of antibiotic to prevent diarrhea or yeast infections from forming.  We will culture urine and change the antibiotics if necessary.  Return for reevaluation, or see your primary care provider, for any new or worsening symptoms.  Continue to monitor your temperature at home.  If you develop a fever that does not respond to Tylenol  or ibuprofen, worsening shaking chills, nausea or vomiting or you cannot keep down medication or fluids then you need to go to the ER for evaluation.

## 2024-12-04 NOTE — ED Triage Notes (Signed)
 Pt c/o increased shakiness that started last night. States has never happened before. Gets gradually worse. Denies pain or cold sx's.

## 2024-12-05 ENCOUNTER — Inpatient Hospital Stay
Admission: EM | Admit: 2024-12-05 | Discharge: 2024-12-08 | DRG: 558 | Disposition: A | Discharge status: Home Health | Attending: Internal Medicine | Admitting: Internal Medicine

## 2024-12-05 ENCOUNTER — Emergency Department

## 2024-12-05 DIAGNOSIS — G9340 Encephalopathy, unspecified: Secondary | ICD-10-CM

## 2024-12-05 DIAGNOSIS — Z1152 Encounter for screening for COVID-19: Secondary | ICD-10-CM

## 2024-12-05 DIAGNOSIS — E785 Hyperlipidemia, unspecified: Secondary | ICD-10-CM | POA: Diagnosis present

## 2024-12-05 DIAGNOSIS — I10 Essential (primary) hypertension: Secondary | ICD-10-CM | POA: Diagnosis present

## 2024-12-05 DIAGNOSIS — E1165 Type 2 diabetes mellitus with hyperglycemia: Secondary | ICD-10-CM | POA: Diagnosis present

## 2024-12-05 DIAGNOSIS — N390 Urinary tract infection, site not specified: Principal | ICD-10-CM

## 2024-12-05 DIAGNOSIS — Z87440 Personal history of urinary (tract) infections: Secondary | ICD-10-CM

## 2024-12-05 DIAGNOSIS — R21 Rash and other nonspecific skin eruption: Secondary | ICD-10-CM | POA: Diagnosis present

## 2024-12-05 DIAGNOSIS — W19XXXA Unspecified fall, initial encounter: Secondary | ICD-10-CM | POA: Diagnosis present

## 2024-12-05 DIAGNOSIS — Z79899 Other long term (current) drug therapy: Secondary | ICD-10-CM

## 2024-12-05 DIAGNOSIS — Y92000 Kitchen of unspecified non-institutional (private) residence as  the place of occurrence of the external cause: Secondary | ICD-10-CM

## 2024-12-05 DIAGNOSIS — B9620 Unspecified Escherichia coli [E. coli] as the cause of diseases classified elsewhere: Secondary | ICD-10-CM | POA: Diagnosis present

## 2024-12-05 DIAGNOSIS — M6282 Rhabdomyolysis: Principal | ICD-10-CM | POA: Diagnosis present

## 2024-12-05 DIAGNOSIS — N3000 Acute cystitis without hematuria: Secondary | ICD-10-CM | POA: Diagnosis present

## 2024-12-05 DIAGNOSIS — Z7984 Long term (current) use of oral hypoglycemic drugs: Secondary | ICD-10-CM

## 2024-12-05 DIAGNOSIS — K219 Gastro-esophageal reflux disease without esophagitis: Secondary | ICD-10-CM | POA: Diagnosis present

## 2024-12-05 LAB — CBC
HCT: 39.1 % (ref 36.0–46.0)
HCT: 34.8 % — ABNORMAL LOW (ref 36.0–46.0)
Hemoglobin: 13.4 g/dL (ref 12.0–15.0)
Hemoglobin: 12 g/dL (ref 12.0–15.0)
MCH: 32.4 pg (ref 26.0–34.0)
MCH: 33 pg (ref 26.0–34.0)
MCHC: 34.3 g/dL (ref 30.0–36.0)
MCHC: 34.5 g/dL (ref 30.0–36.0)
MCV: 94.7 fL (ref 80.0–100.0)
MCV: 95.6 fL (ref 80.0–100.0)
Platelets: 170 K/uL (ref 150–400)
Platelets: 168 K/uL (ref 150–400)
RBC: 4.13 MIL/uL (ref 3.87–5.11)
RBC: 3.64 MIL/uL — ABNORMAL LOW (ref 3.87–5.11)
RDW: 12.3 % (ref 11.5–15.5)
RDW: 12.4 % (ref 11.5–15.5)
WBC: 11.2 K/uL — ABNORMAL HIGH (ref 4.0–10.5)
WBC: 11.3 K/uL — ABNORMAL HIGH (ref 4.0–10.5)
nRBC: 0 % (ref 0.0–0.2)
nRBC: 0 % (ref 0.0–0.2)

## 2024-12-05 LAB — URINALYSIS, COMPLETE (UACMP) WITH MICROSCOPIC
Bacteria, UA: NONE SEEN
Bilirubin Urine: NEGATIVE
Glucose, UA: NEGATIVE mg/dL
Ketones, ur: 5 mg/dL — AB
Nitrite: NEGATIVE
Protein, ur: 100 mg/dL — AB
Specific Gravity, Urine: 1.02 (ref 1.005–1.030)
WBC, UA: >50 WBC/hpf (ref 0–5)
pH: 5 (ref 5.0–8.0)

## 2024-12-05 LAB — BASIC METABOLIC PANEL WITH GFR
Anion gap: 15 (ref 5–15)
BUN: 30 mg/dL — ABNORMAL HIGH (ref 8–23)
CO2: 23 mmol/L (ref 22–32)
Calcium: 10.4 mg/dL — ABNORMAL HIGH (ref 8.9–10.3)
Chloride: 101 mmol/L (ref 98–111)
Creatinine, Ser: 1.03 mg/dL — ABNORMAL HIGH (ref 0.44–1.00)
GFR, Estimated: 55 mL/min — ABNORMAL LOW
Glucose, Bld: 134 mg/dL — ABNORMAL HIGH (ref 70–99)
Potassium: 3.9 mmol/L (ref 3.5–5.1)
Sodium: 139 mmol/L (ref 135–145)

## 2024-12-05 LAB — CREATININE, SERUM
Creatinine, Ser: 0.92 mg/dL (ref 0.44–1.00)
GFR, Estimated: >60 mL/min

## 2024-12-05 LAB — HEPATIC FUNCTION PANEL
ALT: 58 U/L — ABNORMAL HIGH (ref 0–44)
AST: 176 U/L — ABNORMAL HIGH (ref 15–41)
Albumin: 3.5 g/dL (ref 3.5–5.0)
Alkaline Phosphatase: 61 U/L (ref 38–126)
Bilirubin, Direct: 0.5 mg/dL — ABNORMAL HIGH (ref 0.0–0.2)
Indirect Bilirubin: 0.5 mg/dL (ref 0.3–0.9)
Total Bilirubin: 0.9 mg/dL (ref 0.0–1.2)
Total Protein: 6.4 g/dL — ABNORMAL LOW (ref 6.5–8.1)

## 2024-12-05 LAB — CK: Total CK: 9512 U/L — ABNORMAL HIGH (ref 38–234)

## 2024-12-05 LAB — CBG MONITORING, ED
Glucose-Capillary: 114 mg/dL — ABNORMAL HIGH (ref 70–99)
Glucose-Capillary: 125 mg/dL — ABNORMAL HIGH (ref 70–99)

## 2024-12-05 LAB — MAGNESIUM: Magnesium: 1.7 mg/dL (ref 1.7–2.4)

## 2024-12-05 MED ORDER — INSULIN ASPART 100 UNIT/ML IJ SOLN: 0.0000 [IU] | Freq: Every day | INTRAMUSCULAR | Status: DC

## 2024-12-05 MED ORDER — LACTATED RINGERS IV SOLN: INTRAVENOUS | Status: AC

## 2024-12-05 MED ORDER — SODIUM CHLORIDE 0.9 % IV BOLUS
1000.0000 mL | Freq: Once | INTRAVENOUS | Status: AC
  Administered 2024-12-05: 1000 mL via INTRAVENOUS

## 2024-12-05 MED ORDER — SODIUM CHLORIDE 0.9 % IV SOLN
1.0000 g | INTRAVENOUS | Status: AC
  Administered 2024-12-05: 1 g via INTRAVENOUS
  Filled 2024-12-05: qty 10

## 2024-12-05 MED ORDER — ONDANSETRON HCL 4 MG PO TABS: 4.0000 mg | ORAL_TABLET | Freq: Four times a day (QID) | ORAL | Status: DC | PRN

## 2024-12-05 MED ORDER — HEPARIN SODIUM (PORCINE) 5000 UNIT/ML IJ SOLN
5000.0000 [IU] | Freq: Three times a day (TID) | INTRAMUSCULAR | Status: DC
  Administered 2024-12-05 – 2024-12-08 (×8): 5000 [IU] via SUBCUTANEOUS
  Filled 2024-12-05 (×8): qty 1

## 2024-12-05 MED ORDER — ZOLPIDEM TARTRATE 5 MG PO TABS
5.0000 mg | ORAL_TABLET | Freq: Every evening | ORAL | Status: DC | PRN
  Administered 2024-12-06 – 2024-12-07 (×2): 5 mg via ORAL
  Filled 2024-12-05 (×2): qty 1

## 2024-12-05 MED ORDER — ACETAMINOPHEN 325 MG PO TABS
650.0000 mg | ORAL_TABLET | Freq: Four times a day (QID) | ORAL | Status: DC | PRN
  Administered 2024-12-06: 650 mg via ORAL
  Filled 2024-12-05: qty 2

## 2024-12-05 MED ORDER — ONDANSETRON HCL 4 MG/2ML IJ SOLN: 4.0000 mg | Freq: Four times a day (QID) | INTRAMUSCULAR | Status: DC | PRN

## 2024-12-05 MED ORDER — SODIUM CHLORIDE 0.9% FLUSH
3.0000 mL | Freq: Two times a day (BID) | INTRAVENOUS | Status: DC
  Administered 2024-12-05 – 2024-12-08 (×6): 3 mL via INTRAVENOUS

## 2024-12-05 MED ORDER — ACETAMINOPHEN 650 MG RE SUPP: 650.0000 mg | Freq: Four times a day (QID) | RECTAL | Status: DC | PRN

## 2024-12-05 MED ORDER — SODIUM CHLORIDE 0.9 % IV SOLN
1.0000 g | INTRAVENOUS | Status: AC
  Administered 2024-12-06 – 2024-12-07 (×2): 1 g via INTRAVENOUS
  Filled 2024-12-05 (×2): qty 10

## 2024-12-05 MED ORDER — ATORVASTATIN CALCIUM 20 MG PO TABS
20.0000 mg | ORAL_TABLET | Freq: Every day | ORAL | Status: DC
  Administered 2024-12-05: 20 mg via ORAL
  Filled 2024-12-05: qty 1

## 2024-12-05 MED ORDER — INSULIN ASPART 100 UNIT/ML IJ SOLN
0.0000 [IU] | Freq: Three times a day (TID) | INTRAMUSCULAR | Status: DC
  Administered 2024-12-06: 2 [IU] via SUBCUTANEOUS
  Administered 2024-12-07 (×2): 1 [IU] via SUBCUTANEOUS
  Filled 2024-12-05: qty 1
  Filled 2024-12-05 (×2): qty 2

## 2024-12-05 MED ORDER — SENNOSIDES-DOCUSATE SODIUM 8.6-50 MG PO TABS: 1.0000 | ORAL_TABLET | Freq: Every evening | ORAL | Status: DC | PRN

## 2024-12-05 NOTE — H&P (Addendum)
 " History and Physical    Olamide Lahaie FMW:[id number] DOB: [birth year] DOA: 12/05/2024  DOS: the patient was seen and examined on 12/05/2024  PCP: Pisharody, Donzell Sleeper, MD   Patient coming from: Home  I have personally briefly reviewed patient's old medical records in [facility] and CareEverywhere  HPI:   Amna Welker is a 79 y.o. year old female with medical history of hypertension, hyperlipidemia, type 2 diabetes, GERD presenting to the ED after being found on the floor by her family.  Patient is not at her baseline mental status and denies falling.  States she was at her home and was told to come to the hospital to get checked out and does not recall being on the ground or falling at all.  Per the family she called them asking for water and they found her on the ground.  Family is present who state patient was diagnosed with UTI and started on antibiotics.  It appears patient spent the whole night on the ground after her fall. On arrival to the ED patient was noted to be HDS stable.  Lab work and imaging obtained.  CBC with mild leukocytosis otherwise unremarkable.  BMP with mild creatinine elevation, mild hypocalcemia.  Imaging negative for acute trauma.  CK checked and elevated at 9512.  Blood cultures obtained.  Patient started on ceftriaxone  and IVF.  Given need for continued care, [facility] contacted for admission.  Review of Systems: unable to review all systems due to the inability of the patient to answer questions.   Past Medical History:  Diagnosis Date   Adenoma    Borderline diabetic    Diabetes mellitus without complication (HCC)    Edema    GERD (gastroesophageal reflux disease)    Nerve pain     Past Surgical History:  Procedure Laterality Date   colonscopy     HERNIA REPAIR     REPLACEMENT TOTAL KNEE     TONSILLECTOMY       Allergies[1]  Family History  Adopted: Yes  Problem Relation Age of Onset   Breast cancer Neg Hx    Kidney  cancer Neg Hx    Kidney disease Neg Hx    Prostate cancer Neg Hx     Prior to Admission medications  Medication Sig Start Date End Date Taking? Authorizing Provider  atorvastatin  (LIPITOR) 20 MG tablet Take 20 mg by mouth daily.    [provider]  cefdinir  (OMNICEF ) 300 MG capsule Take 1 capsule (300 mg total) by mouth 2 (two) times daily for 7 days. 12/04/24 12/11/24  Bernardino Ditch, NP  glipiZIDE (GLUCOTROL XL) 5 MG 24 hr tablet Take 1 tablet by mouth daily. 02/02/21   [provider]  hydrochlorothiazide (HYDRODIURIL) 25 MG tablet Take 25 mg by mouth daily.    [provider]  lovastatin (MEVACOR) 40 MG tablet SMARTSIG:1 Tablet(s) By Mouth Every Evening 02/18/21   [provider]  Omeprazole-Sodium Bicarbonate (ZEGERID) 20-1100 MG CAPS capsule Take by mouth. 05/09/16   [provider]  phenazopyridine  (PYRIDIUM ) 200 MG tablet Take 1 tablet (200 mg total) by mouth 3 (three) times daily. 12/04/24   Bernardino Ditch, NP  spironolactone (ALDACTONE) 25 MG tablet Take 25 mg by mouth daily.    [provider]  traZODone (DESYREL) 50 MG tablet Take by mouth. 03/11/21 03/26/24  [provider]  zolpidem  (AMBIEN ) 5 MG tablet Take 5 mg by mouth at bedtime as needed for sleep.    [provider]    Social History:  reports that she has never smoked. She has never used smokeless tobacco. She reports current alcohol use. She reports that she does not use drugs.    Physical Exam: Vitals:   12/05/24 1750 12/05/24 1752 12/05/24 1800 12/05/24 2008  BP:  (!) 140/101 (!) 144/80 (!) 151/50  Pulse:  (!) 108 67 70  Resp:  17  20  Temp:      TempSrc:      SpO2: 95% 95% 98% 99%    Gen: NAD HENT: NCAT CV: normal heart sounds Lung: CTAB Abd: No TTP, normal bowel sounds MSK: No asymmetry, good bulk and tone Neuro: alert and oriented x 4, tangential in her answers and patient's family states she is not at baseline   Labs on Admission: I have  personally reviewed following labs and imaging studies  CBC: Recent Labs  Lab 12/05/24 1402  WBC 11.2*  HGB 13.4  HCT 39.1  MCV 94.7  PLT 170   Basic Metabolic Panel: Recent Labs  Lab 12/05/24 1402  NA 139  K 3.9  CL 101  CO2 23  GLUCOSE 134*  BUN 30*  CREATININE 1.03*  CALCIUM  10.4*   GFR: CrCl cannot be calculated (Unknown ideal weight.). Liver Function Tests: No results for input(s): AST, ALT, ALKPHOS, BILITOT, PROT, ALBUMIN in the last 168 hours. No results for input(s): LIPASE, AMYLASE in the last 168 hours. No results for input(s): AMMONIA in the last 168 hours. Coagulation Profile: No results for input(s): INR, PROTIME in the last 168 hours. Cardiac Enzymes: Recent Labs  Lab 12/05/24 1402  CKTOTAL 9,512*   BNP (last 3 results) No results for input(s): BNP in the last 8760 hours. HbA1C: No results for input(s): HGBA1C in the last 72 hours. CBG: Recent Labs  Lab 12/05/24 1809  GLUCAP 114*   Lipid Profile: No results for input(s): CHOL, HDL, LDLCALC, TRIG, CHOLHDL, LDLDIRECT in the last 72 hours. Thyroid  Function Tests: No results for input(s): TSH, T4TOTAL, FREET4, T3FREE, THYROIDAB in the last 72 hours. Anemia Panel: No results for input(s): VITAMINB12, FOLATE, FERRITIN, TIBC, IRON, RETICCTPCT in the last 72 hours. Urine analysis:    Component Value Date/Time   COLORURINE YELLOW 03/08/2018 0916   APPEARANCEUR CLOUDY (A) 03/08/2018 0916   APPEARANCEUR CLOUDY 11/13/2014 0928   LABSPEC 1.020 03/08/2018 0916   LABSPEC 1.015 11/13/2014 0928   PHURINE 6.5 03/08/2018 0916   GLUCOSEU NEGATIVE 03/08/2018 0916   GLUCOSEU NEGATIVE 11/13/2014 0928   HGBUR MODERATE (A) 03/08/2018 0916   BILIRUBINUR small (A) 12/04/2024 1431   BILIRUBINUR NEGATIVE 11/13/2014 0928   KETONESUR small (15) (A) 12/04/2024 1431   KETONESUR NEGATIVE 03/08/2018 0916   PROTEINUR >=300 (A) 12/04/2024 1431   PROTEINUR 30  (A) 03/08/2018 0916   UROBILINOGEN 1.0 12/04/2024 1431   NITRITE Positive (A) 12/04/2024 1431   NITRITE NEGATIVE 03/08/2018 0916   LEUKOCYTESUR Large (3+) (A) 12/04/2024 1431   LEUKOCYTESUR MODERATE 11/13/2014 0928    Radiological Exams on Admission: I have personally reviewed images DG Knee Complete 4 Views Left Result Date: 12/05/2024 CLINICAL DATA:  Clemens EXAM: LEFT KNEE - COMPLETE 4+ VIEW COMPARISON:  None Available. FINDINGS: Frontal, bilateral oblique, lateral views of the left knee are obtained. There is a 3 component left knee arthroplasty in the expected position without evidence of acute complication. There are no acute displaced fractures. No joint effusion. Soft tissues are unremarkable. IMPRESSION: 1. Unremarkable left knee arthroplasty. 2. No acute fracture. Electronically Signed  By: Ozell Daring M.D.   On: 12/05/2024 14:57   DG Knee Complete 4 Views Right Result Date: 12/05/2024 CLINICAL DATA:  Clemens EXAM: RIGHT KNEE - COMPLETE 4+ VIEW COMPARISON:  None Available. FINDINGS: Frontal, bilateral oblique, lateral views of the right knee are obtained. Three component right knee arthroplasty is identified in the expected position without evidence of acute complication. No acute fracture. No joint effusion. Soft tissues are unremarkable. IMPRESSION: 1. Unremarkable right knee arthroplasty. 2. No acute fracture. Electronically Signed   By: Ozell Daring M.D.   On: 12/05/2024 14:56   CT Cervical Spine Wo Contrast Result Date: 12/05/2024 EXAM: CT CERVICAL SPINE WITHOUT CONTRAST 12/05/2024 02:43:44 PM TECHNIQUE: CT of the cervical spine was performed without the administration of intravenous contrast. Multiplanar reformatted images are provided for review. Automated exposure control, iterative reconstruction, and/or weight based adjustment of the mA/kV was utilized to reduce the radiation dose to as low as reasonably achievable. COMPARISON: None available. CLINICAL HISTORY: At least 2 falls  over the last few days. Patient was unable to get up after a fall last night. She was found down. FINDINGS: BONES AND ALIGNMENT: Grade 1 degenerative retrolisthesis is present at C5-C6. No acute fracture or traumatic malalignment. DEGENERATIVE CHANGES: Chronic loss of disc height is present across multiple levels in the cervical spine. Moderate left foraminal stenosis is secondary to facet hypertrophy at C3-C4 and C4-C5. Severe right foraminal narrowing is due to uncovertebral spurring at C6-C7. Facet hypertrophy is worse in the left at C7-T1 without focal stenosis. SOFT TISSUES: No prevertebral soft tissue swelling. IMPRESSION: 1. No acute cervical spine fracture or dislocation. 2. Severe right foraminal narrowing at C6-7 due to uncovertebral spurring. Electronically signed by: Lonni Necessary MD 12/05/2024 02:56 PM EST RP Workstation: [id number]   DG HIPS BILAT WITH PELVIS MIN 5 VIEWS Result Date: 12/05/2024 EXAM: 2 VIEW(S) XRAY OF THE BILATERAL HIP 12/05/2024 02:38:00 PM COMPARISON: None available. CLINICAL HISTORY: 809823 Fall 190176 [id number] Fall 809823 fall fall 190176 Fall 190176 FINDINGS: BONES AND JOINTS: Osteitis pubis noted. No acute fracture. No malalignment. LUMBAR SPINE: Spondylitic changes in visualized lower lumbar spine. SOFT TISSUES: Unremarkable. IMPRESSION: 1. No evidence of acute traumatic injury. Electronically signed by: Waddell Calk MD 12/05/2024 02:55 PM EST RP Workstation: [id number]   CT Head Wo Contrast Result Date: 12/05/2024 EXAM: CT HEAD WITHOUT CONTRAST 12/05/2024 02:43:44 PM TECHNIQUE: CT of the head was performed without the administration of intravenous contrast. Automated exposure control, iterative reconstruction, and/or weight based adjustment of the mA/kV was utilized to reduce the radiation dose to as low as reasonably achievable. COMPARISON: None available. CLINICAL HISTORY: fall fall fall fall fall. 2 recent falls. Trauma to face. Patient was unable to get up after  a fall last night. FINDINGS: BRAIN AND VENTRICLES: No acute hemorrhage. No evidence of acute infarct. No hydrocephalus. No extra-axial collection. No mass effect or midline shift. Age related atrophy. Mild atrophy and white matter changes are within normal limits for age. Atherosclerotic changes are present within the cavernous internal and carotid arteries without significant hyperdense vessel. ORBITS: Right cataract resection. Right lens replacement is present. SINUSES: No acute abnormality. SOFT TISSUES AND SKULL: Minimal supraorbital soft tissue swelling is present without underlying fracture or foreign body. No skull fracture. IMPRESSION: 1. No acute intracranial abnormality related to the reported falls. 2. Minimal supraorbital soft tissue swelling without underlying fracture or foreign body. Electronically signed by: Lonni Necessary MD 12/05/2024 02:53 PM EST RP Workstation: [id number]    EKG: My personal interpretation  of EKG shows: Normal sinus rhythm without any acute ST changes, baseline after artifact present.    Assessment/Plan Principal Problem:   Rhabdomyolysis Active Problems:   GERD (gastroesophageal reflux disease)   Type 2 diabetes mellitus with hyperglycemia, without long-term current use of insulin  (HCC)   Rhabdomyolysis Pt found to have rhabdomyolysis. Mild to moderate rhabdo. CK elevated at 9512. Started pt on IVF. No known hx of HF. Will trend CK. UA ordered. Renal function normal.  Creatinine elevated so we will trend.  UTI: Urine culture from yesterday grew greater than 100,000 E. coli.  Will place patient on ceftriaxone .  Suspect this is likely reason patient's mental status is off baseline.  Will continue to monitor.  UA obtained here to assess for myoglobinuria and not for UTI as it was performed yesterday.  Hypertension: Holding home medicine HCTZ insulin  lactone given elevated creatinine.  Can use IV labetalol as needed for blood pressure.  Type 2 diabetes:  Holding home glipizide.  Start SSI  GERD: Not on any pharmacotherapy.  Continue to monitor  Hyperlipidemia: Holding home Lipitor until liver Function testing comes back.  If normal can resume   VTE prophylaxis:  SQ Heparin   Diet: N.p.o. Code Status:  Full Code Telemetry:  Admission status: Observation, Telemetry bed Patient is from: Home Anticipated d/c is to: Home Anticipated d/c is in: 1-2 days   Family Communication: Updated at bedside  Consults called: None   Severity of Illness: The appropriate patient status for this patient is OBSERVATION. Observation status is judged to be reasonable and necessary in order to provide the required intensity of service to ensure the patient's safety. The patient's presenting symptoms, physical exam findings, and initial radiographic and laboratory data in the context of their medical condition is felt to place them at decreased risk for further clinical deterioration. Furthermore, it is anticipated that the patient will be medically stable for discharge from the hospital within 2 midnights of admission.    Morene Bathe, MD [facility] DEL. [facility]     [1]  Allergies Allergen Reactions   Metformin Nausea Only   Valsartan Swelling   "

## 2024-12-05 NOTE — ED Provider Notes (Signed)
 "  [facility] Provider Note    Event Date/Time   First MD Initiated Contact with Patient 12/05/24 1728     (approximate)   History   Fall  Caveat: Patient with some mild confusion making her slightly poor historian.  Her son and daughter-in-law are with her and provide history  HPI  Marilyn Hayes is a 79 y.o. female  GERD, diabetes, essential hypertension, and recurrent UTIs    Patient reports she is not in any pain except her hips just feel little sore.  She does not have any spots of redness or soreness except a little bit of a rash around her right ankle and some on her knee which I think is probably from her crawling across the carpet last night  She went to urgent care was diagnosed with urinary tract infection yesterday.  She went home to pick up her prescription and then sometime last night fell in the kitchen.  They think she crawled to the bedroom.  She sent a text to family today that she needed water, they thought that meant that they wanted her to go get some from the store.  When they arrived they found that she was actually on the floor and wanted water and they believe she laid on the ground all night  No headache no neck pain.  Reports her hips just feel little bit sore but she demonstrates good range of motion with them.  Reviewed external records from urgent care yesterday  Diagnosed with UTI started on cefdinir  yesterday  Past Medical History:  Diagnosis Date   Adenoma    Borderline diabetic    Diabetes mellitus without complication (HCC)    Edema    GERD (gastroesophageal reflux disease)    Nerve pain      Physical Exam   Triage Vital Signs: ED Triage Vitals  Encounter Vitals Group     BP 12/05/24 1403 (!) 171/150     Girls Systolic BP Percentile --      Girls Diastolic BP Percentile --      Boys Systolic BP Percentile --      Boys Diastolic BP Percentile --      Pulse Rate 12/05/24 1403 75     Resp 12/05/24 1403 18      Temp 12/05/24 1403 97.8 F (36.6 C)     Temp Source 12/05/24 1403 Oral     SpO2 12/05/24 1403 95 %     Weight --      Height --      Head Circumference --      Peak Flow --      Pain Score 12/05/24 1400 6     Pain Loc --      Pain Education --      Exclude from Growth Chart --     Most recent vital signs: Vitals:   12/05/24 1752 12/05/24 1800  BP: (!) 140/101 (!) 144/80  Pulse: (!) 108 67  Resp: 17   Temp:    SpO2: 95% 98%     General: Awake, no distress.  CV:   Good peripheral perfusion. Normal rate and heart tones. Resp:   Normal effort. Lung sounds clear bilateral. Speaking without distress. Abd:   No distention. Soft, non-tender to palpation in all quadrants. No rebound or guarding. Neuro:   No focal neuro deficits noted. Moves extremities well without noted concern.  She moves extremities well is no deficits facial droop or acute focal abnormalities noted but  she does seem a little bit slow slightly forgetful as if missing some elements of the history.  Family does report that she has a history of somewhat occasional poor recollection but in the last day it seems to be worse.  She is normocephalic atraumatic with exception to a slight contusion over the nasal bridge  No cervical tenderness moves all extremities well.  Reports achiness in the hip joint but there is no shortening rotation or deformity and she is able to flex and extend the hips  Other:  Slight abrasion is noted over the foot   ED Results / Procedures / Treatments   Labs (all labs ordered are listed, but only abnormal results are displayed) Labs Reviewed  CBC - Abnormal; Notable for the following components:      Result Value   WBC 11.2 (*)    All other components within normal limits  BASIC METABOLIC PANEL WITH GFR - Abnormal; Notable for the following components:   Glucose, Bld 134 (*)    BUN 30 (*)    Creatinine, Ser 1.03 (*)    Calcium  10.4 (*)    GFR, Estimated 55 (*)    All other  components within normal limits  CK - Abnormal; Notable for the following components:   Total CK 9,512 (*)    All other components within normal limits  CBG MONITORING, ED - Abnormal; Notable for the following components:   Glucose-Capillary 114 (*)    All other components within normal limits  CULTURE, BLOOD (SINGLE)   Reviewed urine culture from yesterday.  Bacteria present but not yet species or sensitivities  EKG  I independently reviewed the EKG it 1825 heart rate 80 QRS 80 QTc 470 Notable artifact.  Sinus rhythm no evidence of acute ischemia   RADIOLOGY   DG Knee Complete 4 Views Left Result Date: 12/05/2024 CLINICAL DATA:  Clemens EXAM: LEFT KNEE - COMPLETE 4+ VIEW COMPARISON:  None Available. FINDINGS: Frontal, bilateral oblique, lateral views of the left knee are obtained. There is a 3 component left knee arthroplasty in the expected position without evidence of acute complication. There are no acute displaced fractures. No joint effusion. Soft tissues are unremarkable. IMPRESSION: 1. Unremarkable left knee arthroplasty. 2. No acute fracture. Electronically Signed   By: Ozell Daring M.D.   On: 12/05/2024 14:57   DG Knee Complete 4 Views Right Result Date: 12/05/2024 CLINICAL DATA:  Clemens EXAM: RIGHT KNEE - COMPLETE 4+ VIEW COMPARISON:  None Available. FINDINGS: Frontal, bilateral oblique, lateral views of the right knee are obtained. Three component right knee arthroplasty is identified in the expected position without evidence of acute complication. No acute fracture. No joint effusion. Soft tissues are unremarkable. IMPRESSION: 1. Unremarkable right knee arthroplasty. 2. No acute fracture. Electronically Signed   By: Ozell Daring M.D.   On: 12/05/2024 14:56   CT Cervical Spine Wo Contrast Result Date: 12/05/2024 EXAM: CT CERVICAL SPINE WITHOUT CONTRAST 12/05/2024 02:43:44 PM TECHNIQUE: CT of the cervical spine was performed without the administration of intravenous contrast.  Multiplanar reformatted images are provided for review. Automated exposure control, iterative reconstruction, and/or weight based adjustment of the mA/kV was utilized to reduce the radiation dose to as low as reasonably achievable. COMPARISON: None available. CLINICAL HISTORY: At least 2 falls over the last few days. Patient was unable to get up after a fall last night. She was found down. FINDINGS: BONES AND ALIGNMENT: Grade 1 degenerative retrolisthesis is present at C5-C6. No acute fracture or traumatic malalignment.  DEGENERATIVE CHANGES: Chronic loss of disc height is present across multiple levels in the cervical spine. Moderate left foraminal stenosis is secondary to facet hypertrophy at C3-C4 and C4-C5. Severe right foraminal narrowing is due to uncovertebral spurring at C6-C7. Facet hypertrophy is worse in the left at C7-T1 without focal stenosis. SOFT TISSUES: No prevertebral soft tissue swelling. IMPRESSION: 1. No acute cervical spine fracture or dislocation. 2. Severe right foraminal narrowing at C6-7 due to uncovertebral spurring. Electronically signed by: Lonni Necessary MD 12/05/2024 02:56 PM EST RP Workstation: [id number]   DG HIPS BILAT WITH PELVIS MIN 5 VIEWS Result Date: 12/05/2024 EXAM: 2 VIEW(S) XRAY OF THE BILATERAL HIP 12/05/2024 02:38:00 PM COMPARISON: None available. CLINICAL HISTORY: 809823 Fall 190176 [id number] Fall 809823 fall fall 190176 Fall 190176 FINDINGS: BONES AND JOINTS: Osteitis pubis noted. No acute fracture. No malalignment. LUMBAR SPINE: Spondylitic changes in visualized lower lumbar spine. SOFT TISSUES: Unremarkable. IMPRESSION: 1. No evidence of acute traumatic injury. Electronically signed by: Waddell Calk MD 12/05/2024 02:55 PM EST RP Workstation: [id number]   CT Head Wo Contrast Result Date: 12/05/2024 EXAM: CT HEAD WITHOUT CONTRAST 12/05/2024 02:43:44 PM TECHNIQUE: CT of the head was performed without the administration of intravenous contrast. Automated exposure  control, iterative reconstruction, and/or weight based adjustment of the mA/kV was utilized to reduce the radiation dose to as low as reasonably achievable. COMPARISON: None available. CLINICAL HISTORY: fall fall fall fall fall. 2 recent falls. Trauma to face. Patient was unable to get up after a fall last night. FINDINGS: BRAIN AND VENTRICLES: No acute hemorrhage. No evidence of acute infarct. No hydrocephalus. No extra-axial collection. No mass effect or midline shift. Age related atrophy. Mild atrophy and white matter changes are within normal limits for age. Atherosclerotic changes are present within the cavernous internal and carotid arteries without significant hyperdense vessel. ORBITS: Right cataract resection. Right lens replacement is present. SINUSES: No acute abnormality. SOFT TISSUES AND SKULL: Minimal supraorbital soft tissue swelling is present without underlying fracture or foreign body. No skull fracture. IMPRESSION: 1. No acute intracranial abnormality related to the reported falls. 2. Minimal supraorbital soft tissue swelling without underlying fracture or foreign body. Electronically signed by: Lonni Necessary MD 12/05/2024 02:53 PM EST RP Workstation: [id number]       PROCEDURES:  Critical Care performed: No  Procedures   MEDICATIONS ORDERED IN ED: Medications  cefTRIAXone  (ROCEPHIN ) 1 g in sodium chloride  0.9 % 100 mL IVPB (1 g Intravenous New Bag/Given 12/05/24 1827)  sodium chloride  0.9 % bolus 1,000 mL (1,000 mLs Intravenous New Bag/Given 12/05/24 1751)     IMPRESSION / MDM / ASSESSMENT AND PLAN / ED COURSE  I reviewed the triage vital signs and the nursing notes.                              Based on presentation, the differential diagnosis includes, but is not limited to key considerations: altered mental DIFFERENTIAL DIAGNOSIS CONSIDERATIONS: High-acuity etiologies considered and prioritized for rule-out: - Hypoglycemia - Hypoxia / Hypercarbia - Sepsis /  Meningitis / Encephalitis - Intracranial Hemorrhage / Stroke - Wernicke's Encephalopathy - Hypertensive Encephalopathy - Toxidrome / Withdrawal - Thyroid  Storm / Myxedema  In this case UTIs suspect but also query if slight change in mental status could be related to the antibiotic she is currently taking  Patient with some confusion suspect likely encephalopathic possibly multifactorial.  On close examination she does not have any obvious areas of pressure injury  she does report tenderness over both hips but her CK is notably elevated.  Her compartments are soft there is no evidence of a compartment syndrome.  I suspect her being down on the ground is led to this.  We will continue to hydrate her and because of her rhabdo admit her to the hospitalist for further care and treatment  Workup focused on identifying reversible metabolic, infectious, or structural causes.    Patient's presentation is most consistent with acute presentation with potential threat to life or bodily function.  ----------------------------------------- 6:24 PM on 12/05/2024 -----------------------------------------  Patient accepted admitted to the hospitalist under the care of Dr. KANDICE Bathe  Patient and her family understand agree with plan  Urine culture demonstrating gram-negative rods     FINAL CLINICAL IMPRESSION(S) / ED DIAGNOSES   Final diagnoses:  Urinary tract infection, acute  Non-traumatic rhabdomyolysis  Encephalopathy, unspecified type     Rx / DC Orders   ED Discharge Orders     None        Note:  This document was prepared using Dragon voice recognition software and may include unintentional dictation errors.   Dicky Anes, MD 12/05/24 1906  "

## 2024-12-05 NOTE — ED Triage Notes (Addendum)
 Pt to ED via [facility] from home. Pt has fallen twice in the past few days. Pt reports was in a hurry and slipped on rug in bathroom and fell in bathroom hitting the tub. Pt states hit face on tub. Pt currently on antibiotic for UTI. Pt reports had been on the floor since fall last night. Daughter found her on the floor today.

## 2024-12-06 ENCOUNTER — Other Ambulatory Visit: Payer: Self-pay

## 2024-12-06 ENCOUNTER — Ambulatory Visit (HOSPITAL_COMMUNITY): Payer: Self-pay

## 2024-12-06 DIAGNOSIS — T796XXA Traumatic ischemia of muscle, initial encounter: Secondary | ICD-10-CM | POA: Diagnosis not present

## 2024-12-06 DIAGNOSIS — K219 Gastro-esophageal reflux disease without esophagitis: Secondary | ICD-10-CM | POA: Diagnosis present

## 2024-12-06 DIAGNOSIS — E1165 Type 2 diabetes mellitus with hyperglycemia: Secondary | ICD-10-CM

## 2024-12-06 DIAGNOSIS — Y92000 Kitchen of unspecified non-institutional (private) residence as  the place of occurrence of the external cause: Secondary | ICD-10-CM | POA: Diagnosis not present

## 2024-12-06 DIAGNOSIS — R21 Rash and other nonspecific skin eruption: Secondary | ICD-10-CM | POA: Diagnosis present

## 2024-12-06 DIAGNOSIS — E785 Hyperlipidemia, unspecified: Secondary | ICD-10-CM | POA: Diagnosis present

## 2024-12-06 DIAGNOSIS — W19XXXA Unspecified fall, initial encounter: Secondary | ICD-10-CM | POA: Diagnosis present

## 2024-12-06 DIAGNOSIS — Z1152 Encounter for screening for COVID-19: Secondary | ICD-10-CM | POA: Diagnosis not present

## 2024-12-06 DIAGNOSIS — B9620 Unspecified Escherichia coli [E. coli] as the cause of diseases classified elsewhere: Secondary | ICD-10-CM | POA: Diagnosis present

## 2024-12-06 DIAGNOSIS — N3000 Acute cystitis without hematuria: Secondary | ICD-10-CM | POA: Diagnosis present

## 2024-12-06 DIAGNOSIS — Z79899 Other long term (current) drug therapy: Secondary | ICD-10-CM | POA: Diagnosis not present

## 2024-12-06 DIAGNOSIS — I10 Essential (primary) hypertension: Secondary | ICD-10-CM

## 2024-12-06 DIAGNOSIS — Z7984 Long term (current) use of oral hypoglycemic drugs: Secondary | ICD-10-CM | POA: Diagnosis not present

## 2024-12-06 DIAGNOSIS — M6282 Rhabdomyolysis: Secondary | ICD-10-CM | POA: Diagnosis present

## 2024-12-06 DIAGNOSIS — Z87440 Personal history of urinary (tract) infections: Secondary | ICD-10-CM | POA: Diagnosis not present

## 2024-12-06 LAB — GLUCOSE, CAPILLARY
Glucose-Capillary: 84 mg/dL (ref 70–99)
Glucose-Capillary: 163 mg/dL — ABNORMAL HIGH (ref 70–99)
Glucose-Capillary: 99 mg/dL (ref 70–99)
Glucose-Capillary: 118 mg/dL — ABNORMAL HIGH (ref 70–99)

## 2024-12-06 LAB — HEMOGLOBIN A1C
Hgb A1c MFr Bld: 7.7 % — ABNORMAL HIGH (ref 4.8–5.6)
Mean Plasma Glucose: 174.29 mg/dL

## 2024-12-06 LAB — CBC
HCT: 31.9 % — ABNORMAL LOW (ref 36.0–46.0)
Hemoglobin: 10.8 g/dL — ABNORMAL LOW (ref 12.0–15.0)
MCH: 32.5 pg (ref 26.0–34.0)
MCHC: 33.9 g/dL (ref 30.0–36.0)
MCV: 96.1 fL (ref 80.0–100.0)
Platelets: 158 K/uL (ref 150–400)
RBC: 3.32 MIL/uL — ABNORMAL LOW (ref 3.87–5.11)
RDW: 12.5 % (ref 11.5–15.5)
WBC: 9.3 K/uL (ref 4.0–10.5)
nRBC: 0 % (ref 0.0–0.2)

## 2024-12-06 LAB — BASIC METABOLIC PANEL WITH GFR
Anion gap: 9 (ref 5–15)
BUN: 31 mg/dL — ABNORMAL HIGH (ref 8–23)
CO2: 25 mmol/L (ref 22–32)
Calcium: 8.9 mg/dL (ref 8.9–10.3)
Chloride: 107 mmol/L (ref 98–111)
Creatinine, Ser: 0.83 mg/dL (ref 0.44–1.00)
GFR, Estimated: >60 mL/min
Glucose, Bld: 85 mg/dL (ref 70–99)
Potassium: 3.7 mmol/L (ref 3.5–5.1)
Sodium: 141 mmol/L (ref 135–145)

## 2024-12-06 LAB — URINE CULTURE
Culture: >=100000 — AB
Special Requests: NORMAL

## 2024-12-06 LAB — CK: Total CK: 4217 U/L — ABNORMAL HIGH (ref 38–234)

## 2024-12-06 MED ORDER — ALUM & MAG HYDROXIDE-SIMETH 200-200-20 MG/5ML PO SUSP
15.0000 mL | ORAL | Status: DC | PRN
  Administered 2024-12-06 – 2024-12-07 (×2): 15 mL via ORAL
  Filled 2024-12-06 (×2): qty 30

## 2024-12-06 MED ORDER — PANTOPRAZOLE SODIUM 40 MG PO TBEC
40.0000 mg | DELAYED_RELEASE_TABLET | Freq: Every day | ORAL | Status: DC
  Administered 2024-12-06 – 2024-12-08 (×3): 40 mg via ORAL
  Filled 2024-12-06 (×3): qty 1

## 2024-12-06 NOTE — Plan of Care (Signed)
   Problem: Education: Goal: Knowledge of General Education information will improve Description Including pain rating scale, medication(s)/side effects and non-pharmacologic comfort measures Outcome: Progressing

## 2024-12-06 NOTE — TOC Initial Note (Signed)
 Transition of Care [facility]) - Initial/Assessment Note    Patient Details  Name: Marilyn Hayes MRN: [id number] Date of Birth: 09-22-1946  [facility]) CM/SW Contact:    Alfonso Rummer, LCSW Phone Number: 12/06/2024, 4:00 PM  Clinical Narrative:                 Pt agreed to home health recommendations. Referral sent via hub.        Patient Goals and CMS Choice            Expected Discharge Plan and Services                                              Prior Living Arrangements/Services                       Activities of Daily Living   ADL Screening (condition at time of admission) Independently performs ADLs?: Yes (appropriate for developmental age) Is the patient deaf or have difficulty hearing?: No Does the patient have difficulty seeing, even when wearing glasses/contacts?: No Does the patient have difficulty concentrating, remembering, or making decisions?: Yes (concentrating)  Permission Sought/Granted                  Emotional Assessment              Admission diagnosis:  Rhabdomyolysis [M62.82] Urinary tract infection, acute [N39.0] Non-traumatic rhabdomyolysis [M62.82] Encephalopathy, unspecified type [G93.40] Patient Active Problem List   Diagnosis Date Noted   Rhabdomyolysis 12/05/2024   Essential hypertension 12/05/2024   Hyperlipidemia 12/05/2024   Edema of both legs 12/04/2024   Fibromyalgia 12/04/2024   GERD (gastroesophageal reflux disease) 12/04/2024   Hyperplastic colon polyp 12/04/2024   Insomnia 12/04/2024   Low back pain 12/04/2024   Osteoarthritis 12/04/2024   Left ear impacted cerumen 10/25/2024   Body mass index (BMI) 40.0-44.9, adult (HCC) 02/08/2021   Type 2 diabetes mellitus with hyperglycemia, without long-term current use of insulin  (HCC) 05/22/2018   Dysuria 09/02/2017   Recurrent UTI 09/02/2017   Yeast infection 09/02/2017   Hx of essential hypertension 09/02/2017    Elevated blood pressure reading 09/02/2017   Type 2 diabetes mellitus without complication, without long-term current use of insulin  (HCC) 05/17/2017   Presence of right artificial knee joint 06/19/2015   Status post right knee replacement 06/03/2015   Primary osteoarthritis of right knee 08/28/2014   Right shoulder pain 04/04/2014   Tinnitus 08/20/2013   Primary localized osteoarthrosis, lower leg 02/20/2012   PCP:  Margorie Donzell Sleeper, MD Pharmacy:   [facility] [phone] [place], [place] - [address] AT [facility] OF [address] [facility] [place] [address] Phone: [phone] Fax: [phone]  [id number] [place], [place] - [address] [place] [address] Phone: [phone] Fax: [phone]     Social Drivers of Health (SDOH) Social History: SDOH Screenings   Food Insecurity: No Food Insecurity (12/06/2024)  Housing: Low Risk (12/06/2024)  Transportation Needs: No Transportation Needs (12/06/2024)  Utilities: Not At Risk (12/06/2024)  Financial Resource Strain: Low Risk  (10/17/2023)   Received from [facility]  Social Connections: Moderately Isolated (12/06/2024)  Tobacco Use: Low Risk (12/05/2024)   SDOH Interventions:     Readmission Risk Interventions  No data to display

## 2024-12-06 NOTE — Progress Notes (Signed)
 1      PROGRESS NOTE    Marilyn Hayes  FMW:[id number] DOB: 11-01-1946 DOA: 12/05/2024 PCP: Margorie Donzell Sleeper, MD   Brief Narrative:   79 y.o. year old female with medical history of hypertension, hyperlipidemia, type 2 diabetes, GERD presenting to the ED after being found on the floor by her family.  Patient is not at her baseline mental status and denies falling.  States she was at her home and was told to come to the hospital to get checked out and does not recall being on the ground or falling at all.  Per the family she called them asking for water and they found her on the ground.  Family is present who state patient was diagnosed with UTI and started on antibiotics   1/16: PT and OT eval   Assessment & Plan:   Principal Problem:   Rhabdomyolysis Active Problems:   GERD (gastroesophageal reflux disease)   Type 2 diabetes mellitus with hyperglycemia, without long-term current use of insulin  (HCC)   Essential hypertension   Hyperlipidemia  Rhabdomyolysis Present on admission CK elevated at 9512-> 4217.  Improving with IVF. trend CK.    UTI: Urine culture growing 100,000 E. coli.  Can sinew ceftriaxone .  Suspect this is likely reason patient's mental status is off baseline.  Stable vitals and improving leukocytosis   Hypertension: Holding home medicine HCTZ insulin  lactone given elevated creatinine.  Can use IV labetalol as needed for blood pressure.   Type 2 diabetes: Holding home glipizide.  Continue SSI   GERD: Protonix  started   Hyperlipidemia: Holding home Lipitor until liver Function testing comes back.  Can resume at discharge   DVT prophylaxis: Heparin  heparin  injection 5,000 Units Start: 12/05/24 2200     Code Status: Full code Family Communication: Updated daughter in law at bedside Disposition Plan: Possible discharge home in next 2 to 3 days depending on clinical condition     Antimicrobials:  Rocephin    Subjective:  Slowly  improving.  Intermittent confusion per daughter-in-law likely due to sleep deprivation and or UTI  Objective: Vitals:   12/05/24 2342 12/06/24 0027 12/06/24 0416 12/06/24 0736  BP: (!) 102/52 (!) 123/57 (!) 107/57 (!) 111/51  Pulse: 72 71 65 65  Resp: 18 16 16 18   Temp:  99.8 F (37.7 C) 99 F (37.2 C) 98.2 F (36.8 C)  TempSrc:      SpO2: 97% 100% 100% 98%  Height:  5' 5 (1.651 m)      Intake/Output Summary (Last 24 hours) at 12/06/2024 1456 Last data filed at 12/05/2024 1857 Gross per 24 hour  Intake 100 ml  Output --  Net 100 ml   There were no vitals filed for this visit.  Examination:  General exam: Appears calm and comfortable  Respiratory system: Clear to auscultation. Respiratory effort normal. Cardiovascular system: S1 & S2 heard, RRR. No murmurs. No pedal edema. Gastrointestinal system: Abdomen is soft, benign Central nervous system: Alert and oriented. No focal neurological deficits. Extremities: Symmetric 5 x 5 power. Skin: No rashes, lesions or ulcers Psychiatry: Judgement and insight appear normal. Mood & affect appropriate.     Data Reviewed: I have personally reviewed following labs and imaging studies  CBC: Recent Labs  Lab 12/05/24 1402 12/05/24 1939 12/06/24 0540  WBC 11.2* 11.3* 9.3  HGB 13.4 12.0 10.8*  HCT 39.1 34.8* 31.9*  MCV 94.7 95.6 96.1  PLT 170 168 158   Basic Metabolic Panel: Recent Labs  Lab 12/05/24 1402  12/05/24 1939 12/06/24 0540  NA 139  --  141  K 3.9  --  3.7  CL 101  --  107  CO2 23  --  25  GLUCOSE 134*  --  85  BUN 30*  --  31*  CREATININE 1.03* 0.92 0.83  CALCIUM  10.4*  --  8.9  MG  --  1.7  --    GFR: Estimated Creatinine Clearance: 59.8 mL/min (by C-G formula based on SCr of 0.83 mg/dL). Liver Function Tests: Recent Labs  Lab 12/05/24 1939  AST 176*  ALT 58*  ALKPHOS 61  BILITOT 0.9  PROT 6.4*  ALBUMIN 3.5   No results for input(s): LIPASE, AMYLASE in the last 168 hours. No results for  input(s): AMMONIA in the last 168 hours. Coagulation Profile: No results for input(s): INR, PROTIME in the last 168 hours. Cardiac Enzymes: Recent Labs  Lab 12/05/24 1402 12/06/24 0540  CKTOTAL 9,512* 4,217*   BNP (last 3 results) No results for input(s): PROBNP in the last 8760 hours. HbA1C: Recent Labs    12/05/24 1939  HGBA1C 7.7*   CBG: Recent Labs  Lab 12/05/24 1809 12/05/24 2229 12/06/24 0738 12/06/24 1128  GLUCAP 114* 125* 84 163*    Recent Results (from the past 240 hours)  Urine Culture     Status: Abnormal   Collection Time: 12/04/24  2:31 PM   Specimen: Urine, Clean Catch  Result Value Ref Range Status   Specimen Description   Final    URINE, CLEAN CATCH Performed at [facility], [address], [place], [place] [address]    Special Requests   Final    Normal Performed at [facility], [address], [place], [place] [address]    Culture >=100,000 COLONIES/mL ESCHERICHIA COLI (A)  Final   Report Status 12/06/2024 FINAL  Final   Organism ID, Bacteria ESCHERICHIA COLI (A)  Final      Susceptibility   Escherichia coli - MIC*    AMPICILLIN <=2 SENSITIVE Sensitive     CEFAZOLIN (URINE) Value in next row Sensitive      <=1 SENSITIVEThis is a modified FDA-approved test that has been validated and its performance characteristics determined by the reporting laboratory.  This laboratory is certified under the Clinical Laboratory Improvement Amendments CLIA as qualified to perform high complexity clinical laboratory testing.    CEFEPIME Value in next row Sensitive      <=1 SENSITIVEThis is a modified FDA-approved test that has been validated and its performance characteristics determined by the reporting laboratory.  This laboratory is certified under the Clinical Laboratory Improvement Amendments CLIA as qualified to perform high complexity clinical laboratory testing.    ERTAPENEM Value in next row Sensitive      <=1  SENSITIVEThis is a modified FDA-approved test that has been validated and its performance characteristics determined by the reporting laboratory.  This laboratory is certified under the Clinical Laboratory Improvement Amendments CLIA as qualified to perform high complexity clinical laboratory testing.    CEFTRIAXONE  Value in next row Sensitive      <=1 SENSITIVEThis is a modified FDA-approved test that has been validated and its performance characteristics determined by the reporting laboratory.  This laboratory is certified under the Clinical Laboratory Improvement Amendments CLIA as qualified to perform high complexity clinical laboratory testing.    CIPROFLOXACIN Value in next row Sensitive      <=1 SENSITIVEThis is a modified FDA-approved test that has been validated and its performance characteristics  determined by the reporting laboratory.  This laboratory is certified under the Clinical Laboratory Improvement Amendments CLIA as qualified to perform high complexity clinical laboratory testing.    GENTAMICIN Value in next row Sensitive      <=1 SENSITIVEThis is a modified FDA-approved test that has been validated and its performance characteristics determined by the reporting laboratory.  This laboratory is certified under the Clinical Laboratory Improvement Amendments CLIA as qualified to perform high complexity clinical laboratory testing.    NITROFURANTOIN Value in next row Sensitive      <=1 SENSITIVEThis is a modified FDA-approved test that has been validated and its performance characteristics determined by the reporting laboratory.  This laboratory is certified under the Clinical Laboratory Improvement Amendments CLIA as qualified to perform high complexity clinical laboratory testing.    TRIMETH/SULFA Value in next row Sensitive      <=1 SENSITIVEThis is a modified FDA-approved test that has been validated and its performance characteristics determined by the reporting laboratory.  This  laboratory is certified under the Clinical Laboratory Improvement Amendments CLIA as qualified to perform high complexity clinical laboratory testing.    AMPICILLIN/SULBACTAM Value in next row Sensitive      <=1 SENSITIVEThis is a modified FDA-approved test that has been validated and its performance characteristics determined by the reporting laboratory.  This laboratory is certified under the Clinical Laboratory Improvement Amendments CLIA as qualified to perform high complexity clinical laboratory testing.    PIP/TAZO Value in next row Sensitive      <=4 SENSITIVEThis is a modified FDA-approved test that has been validated and its performance characteristics determined by the reporting laboratory.  This laboratory is certified under the Clinical Laboratory Improvement Amendments CLIA as qualified to perform high complexity clinical laboratory testing.    MEROPENEM Value in next row Sensitive      <=4 SENSITIVEThis is a modified FDA-approved test that has been validated and its performance characteristics determined by the reporting laboratory.  This laboratory is certified under the Clinical Laboratory Improvement Amendments CLIA as qualified to perform high complexity clinical laboratory testing.    * >=100,000 COLONIES/mL ESCHERICHIA COLI  Blood culture (single)     Status: None (Preliminary result)   Collection Time: 12/05/24  6:05 PM   Specimen: BLOOD  Result Value Ref Range Status   Specimen Description BLOOD RIGHT ANTECUBITAL  Final   Special Requests   Final    BOTTLES DRAWN AEROBIC AND ANAEROBIC Blood Culture results may not be optimal due to an inadequate volume of blood received in culture bottles   Culture   Final    NO GROWTH < 12 HOURS Performed at [facility], [address] [place], [place] [address]    Report Status PENDING  Incomplete         Radiology Studies: DG Knee Complete 4 Views Left Result Date: 12/05/2024 CLINICAL DATA:  Clemens EXAM: LEFT KNEE -  COMPLETE 4+ VIEW COMPARISON:  None Available. FINDINGS: Frontal, bilateral oblique, lateral views of the left knee are obtained. There is a 3 component left knee arthroplasty in the expected position without evidence of acute complication. There are no acute displaced fractures. No joint effusion. Soft tissues are unremarkable. IMPRESSION: 1. Unremarkable left knee arthroplasty. 2. No acute fracture. Electronically Signed   By: Ozell Daring M.D.   On: 12/05/2024 14:57   DG Knee Complete 4 Views Right Result Date: 12/05/2024 CLINICAL DATA:  Clemens EXAM: RIGHT KNEE - COMPLETE 4+ VIEW COMPARISON:  None Available. FINDINGS: Frontal,  bilateral oblique, lateral views of the right knee are obtained. Three component right knee arthroplasty is identified in the expected position without evidence of acute complication. No acute fracture. No joint effusion. Soft tissues are unremarkable. IMPRESSION: 1. Unremarkable right knee arthroplasty. 2. No acute fracture. Electronically Signed   By: Ozell Daring M.D.   On: 12/05/2024 14:56   CT Cervical Spine Wo Contrast Result Date: 12/05/2024 EXAM: CT CERVICAL SPINE WITHOUT CONTRAST 12/05/2024 02:43:44 PM TECHNIQUE: CT of the cervical spine was performed without the administration of intravenous contrast. Multiplanar reformatted images are provided for review. Automated exposure control, iterative reconstruction, and/or weight based adjustment of the mA/kV was utilized to reduce the radiation dose to as low as reasonably achievable. COMPARISON: None available. CLINICAL HISTORY: At least 2 falls over the last few days. Patient was unable to get up after a fall last night. She was found down. FINDINGS: BONES AND ALIGNMENT: Grade 1 degenerative retrolisthesis is present at C5-C6. No acute fracture or traumatic malalignment. DEGENERATIVE CHANGES: Chronic loss of disc height is present across multiple levels in the cervical spine. Moderate left foraminal stenosis is secondary to  facet hypertrophy at C3-C4 and C4-C5. Severe right foraminal narrowing is due to uncovertebral spurring at C6-C7. Facet hypertrophy is worse in the left at C7-T1 without focal stenosis. SOFT TISSUES: No prevertebral soft tissue swelling. IMPRESSION: 1. No acute cervical spine fracture or dislocation. 2. Severe right foraminal narrowing at C6-7 due to uncovertebral spurring. Electronically signed by: Lonni Necessary MD 12/05/2024 02:56 PM EST RP Workstation: [id number]   DG HIPS BILAT WITH PELVIS MIN 5 VIEWS Result Date: 12/05/2024 EXAM: 2 VIEW(S) XRAY OF THE BILATERAL HIP 12/05/2024 02:38:00 PM COMPARISON: None available. CLINICAL HISTORY: [id number] Fall [id number] [id number] Fall 809823 fall fall 190176 Fall 190176 FINDINGS: BONES AND JOINTS: Osteitis pubis noted. No acute fracture. No malalignment. LUMBAR SPINE: Spondylitic changes in visualized lower lumbar spine. SOFT TISSUES: Unremarkable. IMPRESSION: 1. No evidence of acute traumatic injury. Electronically signed by: Waddell Calk MD 12/05/2024 02:55 PM EST RP Workstation: [id number]   CT Head Wo Contrast Result Date: 12/05/2024 EXAM: CT HEAD WITHOUT CONTRAST 12/05/2024 02:43:44 PM TECHNIQUE: CT of the head was performed without the administration of intravenous contrast. Automated exposure control, iterative reconstruction, and/or weight based adjustment of the mA/kV was utilized to reduce the radiation dose to as low as reasonably achievable. COMPARISON: None available. CLINICAL HISTORY: fall fall fall fall fall. 2 recent falls. Trauma to face. Patient was unable to get up after a fall last night. FINDINGS: BRAIN AND VENTRICLES: No acute hemorrhage. No evidence of acute infarct. No hydrocephalus. No extra-axial collection. No mass effect or midline shift. Age related atrophy. Mild atrophy and white matter changes are within normal limits for age. Atherosclerotic changes are present within the cavernous internal and carotid arteries without significant  hyperdense vessel. ORBITS: Right cataract resection. Right lens replacement is present. SINUSES: No acute abnormality. SOFT TISSUES AND SKULL: Minimal supraorbital soft tissue swelling is present without underlying fracture or foreign body. No skull fracture. IMPRESSION: 1. No acute intracranial abnormality related to the reported falls. 2. Minimal supraorbital soft tissue swelling without underlying fracture or foreign body. Electronically signed by: Lonni Necessary MD 12/05/2024 02:53 PM EST RP Workstation: [id number]        Scheduled Meds:  heparin   5,000 Units Subcutaneous Q8H   insulin  aspart  0-5 Units Subcutaneous QHS   insulin  aspart  0-9 Units Subcutaneous TID WC   sodium chloride  flush  3 mL Intravenous Q12H  Continuous Infusions:  cefTRIAXone  (ROCEPHIN )  IV     lactated ringers  100 mL/hr at 12/06/24 0717     LOS: 0 days    Time spent: 35 minutes    Cresencio Fairly, MD [facility] Pager [phone]  If 7PM-7AM, please contact night-coverage [email or web address]  12/06/2024, 2:56 PM

## 2024-12-06 NOTE — Progress Notes (Signed)
 Physical Therapy Evaluation Patient Details Name: Marilyn Hayes MRN: [id number] DOB: 07-Oct-1946 Today's Date: 12/06/2024  History of Present Illness  Patient is a 79 year old female found down on the floor by her family. Found to have rhabdomyolysis. PMH: hypertension, hyperlipidemia, type 2 diabetes, GERD, bilateral knee surgery, recent diagnosis of UTI  Clinical Impression  Patient is agreeable to PT evaluation. She lives alone and is independent with mobility.  Today the patient reports soreness in hips and knees. She does require minimal assistance with bed mobility. She was able to stand and walk a lap in the hallway without assistive device. No loss of balance with walking, no dizziness reported, no increased pain reported. Recommend to continue PT to maximize independence and decrease caregiver burden.       If plan is discharge home, recommend the following: A little help with walking and/or transfers;Assist for transportation   Can travel by private vehicle        Equipment Recommendations None recommended by PT  Recommendations for Other Services       Functional Status Assessment Patient has had a recent decline in their functional status and demonstrates the ability to make significant improvements in function in a reasonable and predictable amount of time.     Precautions / Restrictions Precautions Precautions: Fall Restrictions Weight Bearing Restrictions Per Provider Order: No      Mobility  Bed Mobility Overal bed mobility: Needs Assistance Bed Mobility: Supine to Sit, Sit to Supine     Supine to sit: Min assist Sit to supine: Min assist   General bed mobility comments: assistance for LE and trunk support. cues for initiation and technique as patient reports having the most difficulty with bed mobility    Transfers Overall transfer level: Needs assistance Equipment used: None Transfers: Sit to/from Stand Sit to Stand: Supervision            General transfer comment: no assistance required for transfers    Ambulation/Gait Ambulation/Gait assistance: Supervision Gait Distance (Feet): 175 Feet Assistive device: None Gait Pattern/deviations: Step-through pattern Gait velocity: decreased     General Gait Details: patient walked a lap in the hallway without assistive device, no loss of balance, no increased pain reported.  Stairs            Wheelchair Mobility     Tilt Bed    Modified Rankin (Stroke Patients Only)       Balance Overall balance assessment: Needs assistance Sitting-balance support: Feet supported Sitting balance-Leahy Scale: Good     Standing balance support: No upper extremity supported Standing balance-Leahy Scale: Fair                               Pertinent Vitals/Pain Pain Assessment Pain Assessment: Faces Pain Score: 2  Pain Location: both hips and knees Pain Descriptors / Indicators: Sore Pain Intervention(s): Limited activity within patient's tolerance, Monitored during session, Repositioned    Home Living Family/patient expects to be discharged to:: Private residence Living Arrangements: Alone Available Help at Discharge: Family Type of Home: House Home Access: Stairs to enter Entrance Stairs-Rails: Doctor, General Practice of Steps: 5   Home Layout: One level Home Equipment: Agricultural Consultant (2 wheels);Cane - single point      Prior Function Prior Level of Function : Independent/Modified Independent                     Extremity/Trunk Assessment  Upper Extremity Assessment Upper Extremity Assessment: Overall WFL for tasks assessed    Lower Extremity Assessment Lower Extremity Assessment: Overall WFL for tasks assessed (mild edema noted bilaterally which patient reports is normal for her. no knee buckling with weight bearing. bruising noted on both legs)       Communication   Communication Communication: No apparent  difficulties    Cognition Arousal: Alert Behavior During Therapy: WFL for tasks assessed/performed   PT - Cognitive impairments: No family/caregiver present to determine baseline, Awareness, Memory, Attention                       PT - Cognition Comments: tangential conversations, cues for attention to task Following commands: Intact       Cueing Cueing Techniques: Verbal cues     General Comments      Exercises     Assessment/Plan    PT Assessment Patient needs continued PT services  PT Problem List Decreased mobility;Pain;Decreased cognition       PT Treatment Interventions DME instruction;Stair training;Balance training;Neuromuscular re-education;Therapeutic exercise;Functional mobility training;Therapeutic activities;Gait training    PT Goals (Current goals can be found in the Care Plan section)  Acute Rehab PT Goals Patient Stated Goal: to go home PT Goal Formulation: With patient Time For Goal Achievement: 12/20/24 Potential to Achieve Goals: Good    Frequency Min 1X/week     Co-evaluation               AM-PAC PT 6 Clicks Mobility  Outcome Measure Help needed turning from your back to your side while in a flat bed without using bedrails?: A Little Help needed moving from lying on your back to sitting on the side of a flat bed without using bedrails?: A Little Help needed moving to and from a bed to a chair (including a wheelchair)?: A Little Help needed standing up from a chair using your arms (e.g., wheelchair or bedside chair)?: A Little Help needed to walk in hospital room?: A Little Help needed climbing 3-5 steps with a railing? : A Little 6 Click Score: 18    End of Session   Activity Tolerance: Patient tolerated treatment well Patient left: in bed;with call bell/phone within reach;with bed alarm set   PT Visit Diagnosis: Muscle weakness (generalized) (M62.81)    Time: 9160-9089 PT Time Calculation (min) (ACUTE ONLY): 31  min   Charges:   PT Evaluation $PT Eval Low Complexity: 1 Low PT Treatments $Therapeutic Activity: 8-22 mins PT General Charges $$ ACUTE PT VISIT: 1 Visit        Randine Essex, PT, MPT   Randine LULLA Essex 12/06/2024, 10:56 AM

## 2024-12-06 NOTE — Evaluation (Signed)
 Occupational Therapy Evaluation Patient Details Name: Marilyn Hayes MRN: [id number] DOB: August 06, 1946 Today's Date: 12/06/2024   History of Present Illness   Patient is a 79 year old female found down on the floor by her family. Found to have rhabdomyolysis. PMH: hypertension, hyperlipidemia, type 2 diabetes, GERD, bilateral knee surgery, recent diagnosis of UTI     Clinical Impressions Pt admitted with above. Prior to admission, pt lives alone and was independent including driving. Pt tangential throughout, initially states month as October. Requires up to MIN A to perform STS from regular bed height (pt reporting elevated bed at home) and walks a lap on unit without AD. Anticipate ability to perform ADLs with CGA for safety. No LOB observed throughout, family can stay with pt 24/7 at discharge and plans to implement a pillbox for med mgmt.Pt would benefit from skilled OT services to address noted impairments and functional limitations (see below for any additional details) in order to maximize safety and independence while minimizing falls risk and caregiver burden. Anticipate the need for follow up [facility] OT services upon acute hospital DC.      If plan is discharge home, recommend the following:   A little help with walking and/or transfers;A little help with bathing/dressing/bathroom;Direct supervision/assist for medications management;Direct supervision/assist for financial management;Assist for transportation;Help with stairs or ramp for entrance;Supervision due to cognitive status     Functional Status Assessment   Patient has had a recent decline in their functional status and demonstrates the ability to make significant improvements in function in a reasonable and predictable amount of time.     Equipment Recommendations   BSC/3in1      Precautions/Restrictions   Precautions Precautions: Fall Restrictions Weight Bearing Restrictions Per Provider Order: No      Mobility Bed Mobility Overal bed mobility: Needs Assistance Bed Mobility: Supine to Sit, Sit to Supine     Supine to sit: Min assist     General bed mobility comments: assistance for trunk support. pt reaching for HHA to exit bed    Transfers Overall transfer level: Needs assistance Equipment used: None Transfers: Sit to/from Stand Sit to Stand: Min assist           General transfer comment: pt reaching for +1 HHA to stand from low bed (states bed is higher at home)      Balance Overall balance assessment: Needs assistance Sitting-balance support: Feet supported Sitting balance-Leahy Scale: Good     Standing balance support: No upper extremity supported Standing balance-Leahy Scale: Fair                             ADL either performed or assessed with clinical judgement   ADL Overall ADL's : Needs assistance/impaired                                     Functional mobility during ADLs: Contact guard assist General ADL Comments: Pt performs functional mobility 1 lap in hallway, no AD, CGA. Anticipate pt close to baseline for ADL performance.     Vision Baseline Vision/History: 1 Wears glasses Ability to See in Adequate Light: 1 Impaired (does not have reading glasses at hospital) Patient Visual Report: No change from baseline Vision Assessment?: Wears glasses for reading            Pertinent Vitals/Pain Pain Assessment Pain Assessment: Faces Faces Pain Scale:  Hurts a little bit Pain Location: both hips and knees Pain Descriptors / Indicators: Sore Pain Intervention(s): Limited activity within patient's tolerance, Patient requesting pain meds-RN notified     Extremity/Trunk Assessment Upper Extremity Assessment Upper Extremity Assessment: Overall WFL for tasks assessed (bruising on BUE from fall)   Lower Extremity Assessment Lower Extremity Assessment: Overall WFL for tasks assessed (mild edema (pt states normal), bruising  on both knees from fall)   Cervical / Trunk Assessment Cervical / Trunk Assessment: Normal   Communication Communication Communication: No apparent difficulties   Cognition Arousal: Alert Behavior During Therapy: WFL for tasks assessed/performed Cognition: Difficult to assess, Cognition impaired Difficult to assess due to:  (tangential) Orientation impairments: Time (states month is October) Awareness: Intellectual awareness impaired, Online awareness impaired Memory impairment (select all impairments): Short-term memory, Working civil service fast streamer, Non-declarative long-term memory Attention impairment (select first level of impairment): Focused attention Executive functioning impairment (select all impairments): Reasoning, Problem solving OT - Cognition Comments: Pt tangential througout. Difficulties answering questions, unable to perform formal cognitive screen due to pt refusing to answer questions that is a stupid question or trailing off-topic.                 Following commands: Intact       Cueing  General Comments   Cueing Techniques: Verbal cues  Family entering room end of session and reporting ability to provide 24/7 at discharge.           Home Living Family/patient expects to be discharged to:: Private residence Living Arrangements: Alone Available Help at Discharge: Family Type of Home: House Home Access: Stairs to enter Secretary/administrator of Steps: 5 Entrance Stairs-Rails: Right;Left Home Layout: One level     Bathroom Shower/Tub: Chief Strategy Officer: Standard     Home Equipment: Agricultural Consultant (2 wheels);Cane - single point          Prior Functioning/Environment Prior Level of Function : Independent/Modified Independent;Driving;History of Falls (last six months)                    OT Problem List: Decreased strength;Decreased cognition;Decreased safety awareness;Pain;Increased edema;Decreased knowledge of  precautions;Decreased knowledge of use of DME or AE;Impaired balance (sitting and/or standing);Decreased activity tolerance   OT Treatment/Interventions: Self-care/ADL training;Neuromuscular education;Energy conservation;Therapeutic activities;Patient/family education;Balance training;DME and/or AE instruction      OT Goals(Current goals can be found in the care plan section)   Acute Rehab OT Goals OT Goal Formulation: With patient Time For Goal Achievement: 12/20/24 Potential to Achieve Goals: Good   OT Frequency:  Min 2X/week       AM-PAC OT 6 Clicks Daily Activity     Outcome Measure Help from another person eating meals?: None Help from another person taking care of personal grooming?: None Help from another person toileting, which includes using toliet, bedpan, or urinal?: A Little Help from another person bathing (including washing, rinsing, drying)?: A Little Help from another person to put on and taking off regular upper body clothing?: None Help from another person to put on and taking off regular lower body clothing?: A Little 6 Click Score: 21   End of Session Equipment Utilized During Treatment: Gait belt Nurse Communication: Mobility status;Patient requests pain meds  Activity Tolerance: Patient tolerated treatment well;No increased pain Patient left: in chair;with call bell/phone within reach;with chair alarm set;with family/visitor present  OT Visit Diagnosis: History of falling (Z91.81);Other abnormalities of gait and mobility (R26.89);Pain Pain - Right/Left: Left Pain - part  of body: Hip                Time: 9089-9061 OT Time Calculation (min): 28 min Charges:  OT General Charges $OT Visit: 1 Visit OT Evaluation $OT Eval Low Complexity: 1 Low  Marigrace Mccole L. Nykolas Bacallao, OTR/L  12/06/24, 11:06 AM

## 2024-12-06 NOTE — Progress Notes (Addendum)
 Admitted to 2C-219. A&Ox 4. Telemetry placed. Oriented to the unit including fall safety and call-light use to which she stated understanding.

## 2024-12-07 DIAGNOSIS — E1165 Type 2 diabetes mellitus with hyperglycemia: Secondary | ICD-10-CM | POA: Diagnosis not present

## 2024-12-07 DIAGNOSIS — K219 Gastro-esophageal reflux disease without esophagitis: Secondary | ICD-10-CM | POA: Diagnosis not present

## 2024-12-07 DIAGNOSIS — I10 Essential (primary) hypertension: Secondary | ICD-10-CM | POA: Diagnosis not present

## 2024-12-07 DIAGNOSIS — T796XXA Traumatic ischemia of muscle, initial encounter: Secondary | ICD-10-CM | POA: Diagnosis not present

## 2024-12-07 LAB — CBC
HCT: 30.3 % — ABNORMAL LOW (ref 36.0–46.0)
Hemoglobin: 10.4 g/dL — ABNORMAL LOW (ref 12.0–15.0)
MCH: 32.6 pg (ref 26.0–34.0)
MCHC: 34.3 g/dL (ref 30.0–36.0)
MCV: 95 fL (ref 80.0–100.0)
Platelets: 159 K/uL (ref 150–400)
RBC: 3.19 MIL/uL — ABNORMAL LOW (ref 3.87–5.11)
RDW: 12.7 % (ref 11.5–15.5)
WBC: 7.1 K/uL (ref 4.0–10.5)
nRBC: 0 % (ref 0.0–0.2)

## 2024-12-07 LAB — GLUCOSE, CAPILLARY
Glucose-Capillary: 96 mg/dL (ref 70–99)
Glucose-Capillary: 148 mg/dL — ABNORMAL HIGH (ref 70–99)
Glucose-Capillary: 142 mg/dL — ABNORMAL HIGH (ref 70–99)
Glucose-Capillary: 144 mg/dL — ABNORMAL HIGH (ref 70–99)

## 2024-12-07 LAB — BASIC METABOLIC PANEL WITH GFR
Anion gap: 9 (ref 5–15)
BUN: 24 mg/dL — ABNORMAL HIGH (ref 8–23)
CO2: 23 mmol/L (ref 22–32)
Calcium: 8.6 mg/dL — ABNORMAL LOW (ref 8.9–10.3)
Chloride: 109 mmol/L (ref 98–111)
Creatinine, Ser: 0.75 mg/dL (ref 0.44–1.00)
GFR, Estimated: >60 mL/min
Glucose, Bld: 99 mg/dL (ref 70–99)
Potassium: 3.5 mmol/L (ref 3.5–5.1)
Sodium: 142 mmol/L (ref 135–145)

## 2024-12-07 LAB — CK: Total CK: 1848 U/L — ABNORMAL HIGH (ref 38–234)

## 2024-12-07 MED ORDER — SODIUM CHLORIDE 0.9 % IV SOLN: INTRAVENOUS | Status: DC

## 2024-12-07 NOTE — Progress Notes (Signed)
 1      PROGRESS NOTE    Marilyn Hayes  FMW:[id number] DOB: Apr 05, 1946 DOA: 12/05/2024 PCP: Margorie Donzell Sleeper, MD   Brief Narrative:   79 y.o. year old female with medical history of hypertension, hyperlipidemia, type 2 diabetes, GERD presenting to the ED after being found on the floor by her family.  Patient is not at her baseline mental status and denies falling.  States she was at her home and was told to come to the hospital to get checked out and does not recall being on the ground or falling at all.  Per the family she called them asking for water and they found her on the ground.  Family is present who state patient was diagnosed with UTI and started on antibiotics   1/16: PT and OT eval recommends home health 1/17: CK improving, restart IV fluids   Assessment & Plan:   Principal Problem:   Rhabdomyolysis Active Problems:   GERD (gastroesophageal reflux disease)   Type 2 diabetes mellitus with hyperglycemia, without long-term current use of insulin  (HCC)   Essential hypertension   Hyperlipidemia  Rhabdomyolysis Present on admission CK elevated at 9512-> 4217-> 1848.  Improving with IVF. trend CK.  Continue/restart normal saline   UTI: Urine culture growing 100,000 E. coli.  Continue ceftriaxone .    Hypertension: Holding home medicine HCTZ considering soft/low blood pressure   Type 2 diabetes: Holding home glipizide.  Continue SSI   GERD: Protonix  started   Hyperlipidemia: Holding home Lipitor until liver Function testing comes back.  Can resume at discharge   DVT prophylaxis: Heparin  heparin  injection 5,000 Units Start: 12/05/24 2200     Code Status: Full code Family Communication: Updated daughter in law at bedside Disposition Plan: Possible discharge home with home health tomorrow depending on clinical condition     Antimicrobials:  Rocephin    Subjective:  Continues to improve.  Daughter-in-law requesting neurology referral at discharge  for cognitive function evaluation  Objective: Vitals:   12/06/24 2003 12/07/24 0430 12/07/24 0500 12/07/24 0805  BP: (!) 104/47 (!) 107/44  (!) 101/43  Pulse: 66 (!) 57  (!) 58  Resp: 16 16  16   Temp: 98.8 F (37.1 C) 98.1 F (36.7 C)  98.2 F (36.8 C)  TempSrc: Oral     SpO2: 97% 96%  (!) 89%  Weight:   83.2 kg   Height:        Intake/Output Summary (Last 24 hours) at 12/07/2024 1330 Last data filed at 12/07/2024 0835 Gross per 24 hour  Intake 2491.63 ml  Output --  Net 2491.63 ml   Filed Weights   12/07/24 0500  Weight: 83.2 kg    Examination:  General exam: Appears calm and comfortable  Respiratory system: Clear to auscultation. Respiratory effort normal. Cardiovascular system: S1 & S2 heard, RRR. No murmurs. No pedal edema. Gastrointestinal system: Abdomen is soft, benign Central nervous system: Alert and oriented. No focal neurological deficits. Extremities: Symmetric 5 x 5 power. Skin: No rashes, lesions or ulcers Psychiatry: Judgement and insight appear normal. Mood & affect appropriate.     Data Reviewed: I have personally reviewed following labs and imaging studies  CBC: Recent Labs  Lab 12/05/24 1402 12/05/24 1939 12/06/24 0540 12/07/24 0529  WBC 11.2* 11.3* 9.3 7.1  HGB 13.4 12.0 10.8* 10.4*  HCT 39.1 34.8* 31.9* 30.3*  MCV 94.7 95.6 96.1 95.0  PLT 170 168 158 159   Basic Metabolic Panel: Recent Labs  Lab 12/05/24 1402 12/05/24 1939  12/06/24 0540 12/07/24 0529  NA 139  --  141 142  K 3.9  --  3.7 3.5  CL 101  --  107 109  CO2 23  --  25 23  GLUCOSE 134*  --  85 99  BUN 30*  --  31* 24*  CREATININE 1.03* 0.92 0.83 0.75  CALCIUM  10.4*  --  8.9 8.6*  MG  --  1.7  --   --    GFR: Estimated Creatinine Clearance: 61.8 mL/min (by C-G formula based on SCr of 0.75 mg/dL). Liver Function Tests: Recent Labs  Lab 12/05/24 1939  AST 176*  ALT 58*  ALKPHOS 61  BILITOT 0.9  PROT 6.4*  ALBUMIN 3.5   No results for input(s): LIPASE,  AMYLASE in the last 168 hours. No results for input(s): AMMONIA in the last 168 hours. Coagulation Profile: No results for input(s): INR, PROTIME in the last 168 hours. Cardiac Enzymes: Recent Labs  Lab 12/05/24 1402 12/06/24 0540 12/07/24 0529  CKTOTAL 9,512* 4,217* 1,848*   BNP (last 3 results) No results for input(s): PROBNP in the last 8760 hours. HbA1C: Recent Labs    12/05/24 1939  HGBA1C 7.7*   CBG: Recent Labs  Lab 12/06/24 1128 12/06/24 1612 12/06/24 2141 12/07/24 0805 12/07/24 1207  GLUCAP 163* 99 118* 96 148*    Recent Results (from the past 240 hours)  Urine Culture     Status: Abnormal   Collection Time: 12/04/24  2:31 PM   Specimen: Urine, Clean Catch  Result Value Ref Range Status   Specimen Description   Final    URINE, CLEAN CATCH Performed at [facility], [address] [place], [place] [address]    Special Requests   Final    Normal Performed at [facility], [address] [place], [place] [address]    Culture >=100,000 COLONIES/mL ESCHERICHIA COLI (A)  Final   Report Status 12/06/2024 FINAL  Final   Organism ID, Bacteria ESCHERICHIA COLI (A)  Final      Susceptibility   Escherichia coli - MIC*    AMPICILLIN <=2 SENSITIVE Sensitive     CEFAZOLIN (URINE) Value in next row Sensitive      <=1 SENSITIVEThis is a modified FDA-approved test that has been validated and its performance characteristics determined by the reporting laboratory.  This laboratory is certified under the Clinical Laboratory Improvement Amendments CLIA as qualified to perform high complexity clinical laboratory testing.    CEFEPIME Value in next row Sensitive      <=1 SENSITIVEThis is a modified FDA-approved test that has been validated and its performance characteristics determined by the reporting laboratory.  This laboratory is certified under the Clinical Laboratory Improvement Amendments CLIA as qualified to perform high complexity  clinical laboratory testing.    ERTAPENEM Value in next row Sensitive      <=1 SENSITIVEThis is a modified FDA-approved test that has been validated and its performance characteristics determined by the reporting laboratory.  This laboratory is certified under the Clinical Laboratory Improvement Amendments CLIA as qualified to perform high complexity clinical laboratory testing.    CEFTRIAXONE  Value in next row Sensitive      <=1 SENSITIVEThis is a modified FDA-approved test that has been validated and its performance characteristics determined by the reporting laboratory.  This laboratory is certified under the Clinical Laboratory Improvement Amendments CLIA as qualified to perform high complexity clinical laboratory testing.    CIPROFLOXACIN Value in next row Sensitive      <=  1 SENSITIVEThis is a modified FDA-approved test that has been validated and its performance characteristics determined by the reporting laboratory.  This laboratory is certified under the Clinical Laboratory Improvement Amendments CLIA as qualified to perform high complexity clinical laboratory testing.    GENTAMICIN Value in next row Sensitive      <=1 SENSITIVEThis is a modified FDA-approved test that has been validated and its performance characteristics determined by the reporting laboratory.  This laboratory is certified under the Clinical Laboratory Improvement Amendments CLIA as qualified to perform high complexity clinical laboratory testing.    NITROFURANTOIN Value in next row Sensitive      <=1 SENSITIVEThis is a modified FDA-approved test that has been validated and its performance characteristics determined by the reporting laboratory.  This laboratory is certified under the Clinical Laboratory Improvement Amendments CLIA as qualified to perform high complexity clinical laboratory testing.    TRIMETH/SULFA Value in next row Sensitive      <=1 SENSITIVEThis is a modified FDA-approved test that has been validated and its  performance characteristics determined by the reporting laboratory.  This laboratory is certified under the Clinical Laboratory Improvement Amendments CLIA as qualified to perform high complexity clinical laboratory testing.    AMPICILLIN/SULBACTAM Value in next row Sensitive      <=1 SENSITIVEThis is a modified FDA-approved test that has been validated and its performance characteristics determined by the reporting laboratory.  This laboratory is certified under the Clinical Laboratory Improvement Amendments CLIA as qualified to perform high complexity clinical laboratory testing.    PIP/TAZO Value in next row Sensitive      <=4 SENSITIVEThis is a modified FDA-approved test that has been validated and its performance characteristics determined by the reporting laboratory.  This laboratory is certified under the Clinical Laboratory Improvement Amendments CLIA as qualified to perform high complexity clinical laboratory testing.    MEROPENEM Value in next row Sensitive      <=4 SENSITIVEThis is a modified FDA-approved test that has been validated and its performance characteristics determined by the reporting laboratory.  This laboratory is certified under the Clinical Laboratory Improvement Amendments CLIA as qualified to perform high complexity clinical laboratory testing.    * >=100,000 COLONIES/mL ESCHERICHIA COLI  Blood culture (single)     Status: None (Preliminary result)   Collection Time: 12/05/24  6:05 PM   Specimen: BLOOD  Result Value Ref Range Status   Specimen Description BLOOD RIGHT ANTECUBITAL  Final   Special Requests   Final    BOTTLES DRAWN AEROBIC AND ANAEROBIC Blood Culture results may not be optimal due to an inadequate volume of blood received in culture bottles   Culture   Final    NO GROWTH 2 DAYS Performed at [facility], [address] [place], [place] [address]    Report Status PENDING  Incomplete         Radiology Studies: DG Knee Complete 4 Views  Left Result Date: 12/05/2024 CLINICAL DATA:  Clemens EXAM: LEFT KNEE - COMPLETE 4+ VIEW COMPARISON:  None Available. FINDINGS: Frontal, bilateral oblique, lateral views of the left knee are obtained. There is a 3 component left knee arthroplasty in the expected position without evidence of acute complication. There are no acute displaced fractures. No joint effusion. Soft tissues are unremarkable. IMPRESSION: 1. Unremarkable left knee arthroplasty. 2. No acute fracture. Electronically Signed   By: Ozell Daring M.D.   On: 12/05/2024 14:57   DG Knee Complete 4 Views Right Result Date: 12/05/2024 CLINICAL DATA:  Fell EXAM: RIGHT KNEE - COMPLETE 4+ VIEW COMPARISON:  None Available. FINDINGS: Frontal, bilateral oblique, lateral views of the right knee are obtained. Three component right knee arthroplasty is identified in the expected position without evidence of acute complication. No acute fracture. No joint effusion. Soft tissues are unremarkable. IMPRESSION: 1. Unremarkable right knee arthroplasty. 2. No acute fracture. Electronically Signed   By: Ozell Daring M.D.   On: 12/05/2024 14:56   CT Cervical Spine Wo Contrast Result Date: 12/05/2024 EXAM: CT CERVICAL SPINE WITHOUT CONTRAST 12/05/2024 02:43:44 PM TECHNIQUE: CT of the cervical spine was performed without the administration of intravenous contrast. Multiplanar reformatted images are provided for review. Automated exposure control, iterative reconstruction, and/or weight based adjustment of the mA/kV was utilized to reduce the radiation dose to as low as reasonably achievable. COMPARISON: None available. CLINICAL HISTORY: At least 2 falls over the last few days. Patient was unable to get up after a fall last night. She was found down. FINDINGS: BONES AND ALIGNMENT: Grade 1 degenerative retrolisthesis is present at C5-C6. No acute fracture or traumatic malalignment. DEGENERATIVE CHANGES: Chronic loss of disc height is present across multiple levels in the  cervical spine. Moderate left foraminal stenosis is secondary to facet hypertrophy at C3-C4 and C4-C5. Severe right foraminal narrowing is due to uncovertebral spurring at C6-C7. Facet hypertrophy is worse in the left at C7-T1 without focal stenosis. SOFT TISSUES: No prevertebral soft tissue swelling. IMPRESSION: 1. No acute cervical spine fracture or dislocation. 2. Severe right foraminal narrowing at C6-7 due to uncovertebral spurring. Electronically signed by: Lonni Necessary MD 12/05/2024 02:56 PM EST RP Workstation: [id number]   DG HIPS BILAT WITH PELVIS MIN 5 VIEWS Result Date: 12/05/2024 EXAM: 2 VIEW(S) XRAY OF THE BILATERAL HIP 12/05/2024 02:38:00 PM COMPARISON: None available. CLINICAL HISTORY: 809823 Fall 190176 809823 Fall 809823 fall fall 190176 Fall 190176 FINDINGS: BONES AND JOINTS: Osteitis pubis noted. No acute fracture. No malalignment. LUMBAR SPINE: Spondylitic changes in visualized lower lumbar spine. SOFT TISSUES: Unremarkable. IMPRESSION: 1. No evidence of acute traumatic injury. Electronically signed by: Waddell Calk MD 12/05/2024 02:55 PM EST RP Workstation: [id number]   CT Head Wo Contrast Result Date: 12/05/2024 EXAM: CT HEAD WITHOUT CONTRAST 12/05/2024 02:43:44 PM TECHNIQUE: CT of the head was performed without the administration of intravenous contrast. Automated exposure control, iterative reconstruction, and/or weight based adjustment of the mA/kV was utilized to reduce the radiation dose to as low as reasonably achievable. COMPARISON: None available. CLINICAL HISTORY: fall fall fall fall fall. 2 recent falls. Trauma to face. Patient was unable to get up after a fall last night. FINDINGS: BRAIN AND VENTRICLES: No acute hemorrhage. No evidence of acute infarct. No hydrocephalus. No extra-axial collection. No mass effect or midline shift. Age related atrophy. Mild atrophy and white matter changes are within normal limits for age. Atherosclerotic changes are present within the  cavernous internal and carotid arteries without significant hyperdense vessel. ORBITS: Right cataract resection. Right lens replacement is present. SINUSES: No acute abnormality. SOFT TISSUES AND SKULL: Minimal supraorbital soft tissue swelling is present without underlying fracture or foreign body. No skull fracture. IMPRESSION: 1. No acute intracranial abnormality related to the reported falls. 2. Minimal supraorbital soft tissue swelling without underlying fracture or foreign body. Electronically signed by: Lonni Necessary MD 12/05/2024 02:53 PM EST RP Workstation: [id number]        Scheduled Meds:  heparin   5,000 Units Subcutaneous Q8H   insulin  aspart  0-5 Units Subcutaneous QHS   insulin  aspart  0-9  Units Subcutaneous TID WC   pantoprazole   40 mg Oral Daily   sodium chloride  flush  3 mL Intravenous Q12H   Continuous Infusions:  sodium chloride  75 mL/hr at 12/07/24 1219   cefTRIAXone  (ROCEPHIN )  IV 1 g (12/06/24 1803)     LOS: 1 day    Time spent: 35 minutes    Matia Zelada Maree, MD [facility] Pager [phone]  If 7PM-7AM, please contact night-coverage [email or web address]  12/07/2024, 1:30 PM

## 2024-12-07 NOTE — Progress Notes (Signed)
 Mobility Specialist Progress Note:    12/07/24 1008  Mobility  Activity Ambulated with assistance  Level of Assistance Standby assist, set-up cues, supervision of patient - no hands on  Assistive Device None  Distance Ambulated (ft) 320 ft  Range of Motion/Exercises Active;All extremities  Activity Response Tolerated well  Mobility visit 1 Mobility  Mobility Specialist Start Time (ACUTE ONLY) [id number]  Mobility Specialist Stop Time (ACUTE ONLY) 1008  Mobility Specialist Time Calculation (min) (ACUTE ONLY) 16 min   Pt received in bathroom with NT, agreeable to further mobility. Required supervision to stand and ambulate with no AD. Tolerated well, audible SOB after session; SpO2 95% on RA and HR 86 bpm. Left pt fowlers, daughter in law at bedside. All needs met.  Sherrilee Ditty Mobility Specialist Please contact via Special Educational Needs Teacher or  Rehab office at [phone]

## 2024-12-08 DIAGNOSIS — E785 Hyperlipidemia, unspecified: Secondary | ICD-10-CM | POA: Diagnosis not present

## 2024-12-08 DIAGNOSIS — I10 Essential (primary) hypertension: Secondary | ICD-10-CM | POA: Diagnosis not present

## 2024-12-08 DIAGNOSIS — T796XXA Traumatic ischemia of muscle, initial encounter: Secondary | ICD-10-CM | POA: Diagnosis not present

## 2024-12-08 DIAGNOSIS — K219 Gastro-esophageal reflux disease without esophagitis: Secondary | ICD-10-CM | POA: Diagnosis not present

## 2024-12-08 LAB — GLUCOSE, CAPILLARY: Glucose-Capillary: 120 mg/dL — ABNORMAL HIGH (ref 70–99)

## 2024-12-08 LAB — CK: Total CK: 1396 U/L — ABNORMAL HIGH (ref 38–234)

## 2024-12-08 NOTE — Progress Notes (Signed)
 Patient being discharged home with home health PT and OT.  Patient to be transported by her son.  IV removed with the catheter intact.  Discharge paperwork given to the patient and son who verbalized understanding.

## 2024-12-08 NOTE — Progress Notes (Signed)
 Mobility Specialist Progress Note:    12/08/24 1000  Mobility  Activity Ambulated with assistance  Level of Assistance Standby assist, set-up cues, supervision of patient - no hands on  Assistive Device None  Activity Response Tolerated well  Mobility visit 1 Mobility  Mobility Specialist Start Time (ACUTE ONLY) 0957  Mobility Specialist Stop Time (ACUTE ONLY) 1004  Mobility Specialist Time Calculation (min) (ACUTE ONLY) 7 min   Pt received ambulating in groom without assistance. Assisted pt with SBA/supervision to ambulate. Left pt sitting EOB with alarm on per RN. Educated pt to call when wanting to get OOB for safety. All needs met.  Sherrilee Ditty Mobility Specialist Please contact via Special Educational Needs Teacher or  Rehab office at [phone]

## 2024-12-08 NOTE — Plan of Care (Signed)
"   Alert with confusion to time and situation.  Can be hyper verbal at times and need to be redirected back to instructions. Poor concentration.  Able to follow directions. SBA with rolling walker to and from the bathroom. Grips socks on feet. No reports of pain.  Problem: Coping: Goal: Ability to adjust to condition or change in health will improve Outcome: Progressing   Problem: Health Behavior/Discharge Planning: Goal: Ability to identify and utilize available resources and services will improve Outcome: Progressing   "

## 2024-12-08 NOTE — TOC Transition Note (Signed)
 Transition of Care [facility]) - Discharge Note   Patient Details  Name: Marilyn Hayes MRN: [id number] Date of Birth: 1946-05-05  Transition of Care [facility]) CM/SW Contact:  Marilyn Jackquline RAMAN, RN Phone Number: 12/08/2024, 8:53 AM   Clinical Narrative:   RNCM received a message from the bedside nurse informing me that the patient was discharging home today and needed HH/PT/OT. RNCM called patient @ [phone], introduced myself and my role and explained that discharge planning would be discussed. RNCM reviewed [facility] Agency offers with patient and she chose [facility]. Patient states that her son Marilyn Hayes will be picking her up. MD and bedside nurse made aware. No further concerns. RNCM signing off.     Final next level of care: Home w Home Health Services Barriers to Discharge: Barriers Resolved   Patient Goals and CMS Choice            Discharge Placement                Patient to be transferred to facility by: Son/[name] Name of family member notified: Marilyn Hayes Patient and family notified of of transfer: 12/08/24  Discharge Plan and Services Additional resources added to the After Visit Summary for                            [facility] Arranged: PT, OT [facility] Agency: [facility] Date [facility] Agency Contacted: 12/06/24   Representative spoke with at [facility] Agency: Referral's sesnt via [facility]  Social Drivers of Health (SDOH) Interventions SDOH Screenings   Food Insecurity: No Food Insecurity (12/06/2024)  Housing: Low Risk (12/06/2024)  Transportation Needs: No Transportation Needs (12/06/2024)  Utilities: Not At Risk (12/06/2024)  Financial Resource Strain: Low Risk  (10/17/2023)   Received from [facility]  Social Connections: Moderately Isolated (12/06/2024)  Tobacco Use: Low Risk (12/05/2024)     Readmission Risk Interventions     No data to display

## 2024-12-08 NOTE — Plan of Care (Signed)
" °  Problem: Education: Goal: Ability to describe self-care measures that may prevent or decrease complications (Diabetes Survival Skills Education) will improve Outcome: Adequate for Discharge Goal: Individualized Educational Video(s) Outcome: Adequate for Discharge   Problem: Coping: Goal: Ability to adjust to condition or change in health will improve Outcome: Adequate for Discharge   Problem: Fluid Volume: Goal: Ability to maintain a balanced intake and output will improve Outcome: Adequate for Discharge   Problem: Health Behavior/Discharge Planning: Goal: Ability to identify and utilize available resources and services will improve Outcome: Adequate for Discharge Goal: Ability to manage health-related needs will improve Outcome: Adequate for Discharge   Problem: Metabolic: Goal: Ability to maintain appropriate glucose levels will improve Outcome: Adequate for Discharge   Problem: Nutritional: Goal: Maintenance of adequate nutrition will improve Outcome: Adequate for Discharge Goal: Progress toward achieving an optimal weight will improve Outcome: Adequate for Discharge   Problem: Skin Integrity: Goal: Risk for impaired skin integrity will decrease Outcome: Adequate for Discharge   Problem: Tissue Perfusion: Goal: Adequacy of tissue perfusion will improve Outcome: Adequate for Discharge   Problem: Education: Goal: Knowledge of General Education information will improve Description: Including pain rating scale, medication(s)/side effects and non-pharmacologic comfort measures Outcome: Adequate for Discharge   Problem: Health Behavior/Discharge Planning: Goal: Ability to manage health-related needs will improve Outcome: Adequate for Discharge   Problem: Clinical Measurements: Goal: Ability to maintain clinical measurements within normal limits will improve Outcome: Adequate for Discharge Goal: Will remain free from infection Outcome: Adequate for Discharge Goal:  Diagnostic test results will improve Outcome: Adequate for Discharge Goal: Respiratory complications will improve Outcome: Adequate for Discharge Goal: Cardiovascular complication will be avoided Outcome: Adequate for Discharge   Problem: Activity: Goal: Risk for activity intolerance will decrease Outcome: Adequate for Discharge   Problem: Nutrition: Goal: Adequate nutrition will be maintained Outcome: Adequate for Discharge   Problem: Coping: Goal: Level of anxiety will decrease Outcome: Adequate for Discharge   Problem: Elimination: Goal: Will not experience complications related to bowel motility Outcome: Adequate for Discharge Goal: Will not experience complications related to urinary retention Outcome: Adequate for Discharge   Problem: Pain Managment: Goal: General experience of comfort will improve and/or be controlled Outcome: Adequate for Discharge   Problem: Safety: Goal: Ability to remain free from injury will improve Outcome: Adequate for Discharge   Problem: Skin Integrity: Goal: Risk for impaired skin integrity will decrease Outcome: Adequate for Discharge   Problem: Acute Rehab PT Goals(only PT should resolve) Goal: Pt Will Go Supine/Side To Sit Outcome: Adequate for Discharge Goal: Patient Will Transfer Sit To/From Stand Outcome: Adequate for Discharge Goal: Pt Will Ambulate Outcome: Adequate for Discharge Goal: Pt Will Go Up/Down Stairs Outcome: Adequate for Discharge   Problem: Acute Rehab OT Goals (only OT should resolve) Goal: Pt. Will Perform Upper Body Dressing Outcome: Adequate for Discharge Goal: Pt. Will Perform Lower Body Dressing Outcome: Adequate for Discharge Goal: Pt. Will Transfer To Toilet Outcome: Adequate for Discharge Goal: Pt. Will Perform Toileting-Clothing Manipulation Outcome: Adequate for Discharge   "

## 2024-12-09 NOTE — Discharge Summary (Signed)
 " Physician Discharge Summary   Patient: Marilyn Hayes MRN: [id number] DOB: [birth year]  Admit date:     12/05/2024  Discharge date: 12/08/2024  Discharge Physician: Cresencio Fairly   PCP: Margorie Donzell Sleeper, MD   Recommendations at discharge:   Follow-up with outpatient providers as requested  Discharge Diagnoses: Principal Problem:   Rhabdomyolysis Active Problems:   GERD (gastroesophageal reflux disease)   Type 2 diabetes mellitus with hyperglycemia, without long-term current use of insulin  [facility])   Essential hypertension   Hyperlipidemia  Hospital Course: Assessment and Plan:  79 y.o. year old female with medical history of hypertension, hyperlipidemia, type 2 diabetes, GERD presenting to the ED after being found on the floor by her family.  Patient is not at her baseline mental status and denies falling.  States she was at her home and was told to come to the hospital to get checked out and does not recall being on the ground or falling at all.  Per the family she called them asking for water and they found her on the ground.  Family is present who state patient was diagnosed with UTI and started on antibiotics    1/16: PT and OT eval recommends home health 1/17: CK improving, restart IV fluids    Rhabdomyolysis Present on admission CK elevated at 9512-> 4217-> 1848-> 1396.  Improving with hydration   UTI: Urine culture growing 100,000 E. coli.  Treat with IV ceftriaxone  while in the hospital   Hypertension  Type 2 diabetes:  GERD   Hyperlipidemia: Holding home Lipitor due to rhabdomyolysis         Disposition: Home health Diet recommendation:  Carb modified diet DISCHARGE MEDICATION: Allergies as of 12/08/2024       Reactions   Metformin Nausea Only   Valsartan Swelling        Medication List     PAUSE taking these medications    atorvastatin  20 MG tablet Wait to take this until your doctor or other care provider tells you to start  again. Commonly known as: LIPITOR Take 20 mg by mouth daily.       STOP taking these medications    cefdinir  300 MG capsule Commonly known as: OMNICEF    glipiZIDE 5 MG 24 hr tablet Commonly known as: GLUCOTROL XL   zolpidem  5 MG tablet Commonly known as: AMBIEN        TAKE these medications    hydrochlorothiazide 25 MG tablet Commonly known as: HYDRODIURIL Take 25 mg by mouth daily.   phenazopyridine  200 MG tablet Commonly known as: PYRIDIUM  Take 1 tablet (200 mg total) by mouth 3 (three) times daily.   spironolactone 25 MG tablet Commonly known as: ALDACTONE Take 25 mg by mouth daily.        Contact information for follow-up providers     Pisharody, Donzell Sleeper, MD. Schedule an appointment as soon as possible for a visit in 1 week(s).   Specialty: Family Medicine Why: [facility] Discharge F/UP Contact information: [address] [place] [address] [phone]         Fairly Jannett POUR, MD. Schedule an appointment as soon as possible for a visit in 2 week(s).   Specialty: Neurology Why: [facility] Discharge F/UP Contact information: [address] [facility] [place] [address] [phone]              Contact information for after-discharge care     Home Medical Care     [facility] .   Service: Home Health Services Contact information: [address] [place]  [address] [phone]                    Discharge Exam: Fredricka Weights   12/07/24 0500 12/08/24 0500  Weight: 83.2 kg 88.9 kg   General exam: Appears calm and comfortable  Respiratory system: Clear to auscultation. Respiratory effort normal. Cardiovascular system: S1 & S2 heard, RRR. No murmurs. No pedal edema. Gastrointestinal system: Abdomen is soft, benign Central nervous system: Alert and oriented. No focal neurological deficits. Extremities: Symmetric 5 x 5  power. Skin: No rashes, lesions or ulcers Psychiatry: Judgement and insight appear normal. Mood & affect appropriate.  Condition at discharge: good  The results of significant diagnostics from this hospitalization (including imaging, microbiology, ancillary and laboratory) are listed below for reference.   Imaging Studies: DG Knee Complete 4 Views Left Result Date: 12/05/2024 CLINICAL DATA:  Clemens EXAM: LEFT KNEE - COMPLETE 4+ VIEW COMPARISON:  None Available. FINDINGS: Frontal, bilateral oblique, lateral views of the left knee are obtained. There is a 3 component left knee arthroplasty in the expected position without evidence of acute complication. There are no acute displaced fractures. No joint effusion. Soft tissues are unremarkable. IMPRESSION: 1. Unremarkable left knee arthroplasty. 2. No acute fracture. Electronically Signed   By: Ozell Daring M.D.   On: 12/05/2024 14:57   DG Knee Complete 4 Views Right Result Date: 12/05/2024 CLINICAL DATA:  Clemens EXAM: RIGHT KNEE - COMPLETE 4+ VIEW COMPARISON:  None Available. FINDINGS: Frontal, bilateral oblique, lateral views of the right knee are obtained. Three component right knee arthroplasty is identified in the expected position without evidence of acute complication. No acute fracture. No joint effusion. Soft tissues are unremarkable. IMPRESSION: 1. Unremarkable right knee arthroplasty. 2. No acute fracture. Electronically Signed   By: Ozell Daring M.D.   On: 12/05/2024 14:56   CT Cervical Spine Wo Contrast Result Date: 12/05/2024 EXAM: CT CERVICAL SPINE WITHOUT CONTRAST 12/05/2024 02:43:44 PM TECHNIQUE: CT of the cervical spine was performed without the administration of intravenous contrast. Multiplanar reformatted images are provided for review. Automated exposure control, iterative reconstruction, and/or weight based adjustment of the mA/kV was utilized to reduce the radiation dose to as low as reasonably achievable. COMPARISON: None  available. CLINICAL HISTORY: At least 2 falls over the last few days. Patient was unable to get up after a fall last night. She was found down. FINDINGS: BONES AND ALIGNMENT: Grade 1 degenerative retrolisthesis is present at C5-C6. No acute fracture or traumatic malalignment. DEGENERATIVE CHANGES: Chronic loss of disc height is present across multiple levels in the cervical spine. Moderate left foraminal stenosis is secondary to facet hypertrophy at C3-C4 and C4-C5. Severe right foraminal narrowing is due to uncovertebral spurring at C6-C7. Facet hypertrophy is worse in the left at C7-T1 without focal stenosis. SOFT TISSUES: No prevertebral soft tissue swelling. IMPRESSION: 1. No acute cervical spine fracture or dislocation. 2. Severe right foraminal narrowing at C6-7 due to uncovertebral spurring. Electronically signed by: Lonni Necessary MD 12/05/2024 02:56 PM EST RP Workstation: [id number]   DG HIPS BILAT WITH PELVIS MIN 5 VIEWS Result Date: 12/05/2024 EXAM: 2 VIEW(S) XRAY OF THE BILATERAL HIP 12/05/2024 02:38:00 PM COMPARISON: None available. CLINICAL HISTORY: 809823 Fall 190176 [id number] Fall 809823 fall fall 190176 Fall 190176 FINDINGS: BONES AND JOINTS: Osteitis pubis noted. No acute fracture. No malalignment. LUMBAR SPINE: Spondylitic changes in visualized lower lumbar spine. SOFT  TISSUES: Unremarkable. IMPRESSION: 1. No evidence of acute traumatic injury. Electronically signed by: Waddell Calk MD 12/05/2024 02:55 PM EST RP Workstation: [id number]   CT Head Wo Contrast Result Date: 12/05/2024 EXAM: CT HEAD WITHOUT CONTRAST 12/05/2024 02:43:44 PM TECHNIQUE: CT of the head was performed without the administration of intravenous contrast. Automated exposure control, iterative reconstruction, and/or weight based adjustment of the mA/kV was utilized to reduce the radiation dose to as low as reasonably achievable. COMPARISON: None available. CLINICAL HISTORY: fall fall fall fall fall. 2 recent falls.  Trauma to face. Patient was unable to get up after a fall last night. FINDINGS: BRAIN AND VENTRICLES: No acute hemorrhage. No evidence of acute infarct. No hydrocephalus. No extra-axial collection. No mass effect or midline shift. Age related atrophy. Mild atrophy and white matter changes are within normal limits for age. Atherosclerotic changes are present within the cavernous internal and carotid arteries without significant hyperdense vessel. ORBITS: Right cataract resection. Right lens replacement is present. SINUSES: No acute abnormality. SOFT TISSUES AND SKULL: Minimal supraorbital soft tissue swelling is present without underlying fracture or foreign body. No skull fracture. IMPRESSION: 1. No acute intracranial abnormality related to the reported falls. 2. Minimal supraorbital soft tissue swelling without underlying fracture or foreign body. Electronically signed by: Lonni Necessary MD 12/05/2024 02:53 PM EST RP Workstation: [id number]    Microbiology: Results for orders placed or performed during the hospital encounter of 12/05/24  Blood culture (single)     Status: None (Preliminary result)   Collection Time: 12/05/24  6:05 PM   Specimen: BLOOD  Result Value Ref Range Status   Specimen Description BLOOD RIGHT ANTECUBITAL  Final   Special Requests   Final    BOTTLES DRAWN AEROBIC AND ANAEROBIC Blood Culture results may not be optimal due to an inadequate volume of blood received in culture bottles   Culture   Final    NO GROWTH 4 DAYS Performed at [facility], [address] [place], [place] [address]    Report Status PENDING  Incomplete    Labs: CBC: Recent Labs  Lab 12/05/24 1402 12/05/24 1939 12/06/24 0540 12/07/24 0529  WBC 11.2* 11.3* 9.3 7.1  HGB 13.4 12.0 10.8* 10.4*  HCT 39.1 34.8* 31.9* 30.3*  MCV 94.7 95.6 96.1 95.0  PLT 170 168 158 159   Basic Metabolic Panel: Recent Labs  Lab 12/05/24 1402 12/05/24 1939 12/06/24 0540 12/07/24 0529  NA 139   --  141 142  K 3.9  --  3.7 3.5  CL 101  --  107 109  CO2 23  --  25 23  GLUCOSE 134*  --  85 99  BUN 30*  --  31* 24*  CREATININE 1.03* 0.92 0.83 0.75  CALCIUM  10.4*  --  8.9 8.6*  MG  --  1.7  --   --    Liver Function Tests: Recent Labs  Lab 12/05/24 1939  AST 176*  ALT 58*  ALKPHOS 61  BILITOT 0.9  PROT 6.4*  ALBUMIN 3.5   CBG: Recent Labs  Lab 12/07/24 0805 12/07/24 1207 12/07/24 1610 12/07/24 2116 12/08/24 0821  GLUCAP 96 148* 142* 144* 120*    Discharge time spent: greater than 30 minutes.  Signed: Cresencio Fairly, MD [facility] 12/09/2024 "

## 2024-12-10 LAB — CULTURE, BLOOD (SINGLE): Culture: NO GROWTH
